# Patient Record
Sex: Female | Born: 1975
Health system: Southern US, Community
[De-identification: ages and names within clinical notes are randomized; demographics above are authoritative.]

## PROBLEM LIST (undated history)

## (undated) DIAGNOSIS — L509 Urticaria, unspecified: Secondary | ICD-10-CM

## (undated) DIAGNOSIS — I10 Essential (primary) hypertension: Secondary | ICD-10-CM

## (undated) DIAGNOSIS — T783XXA Angioneurotic edema, initial encounter: Secondary | ICD-10-CM

## (undated) DIAGNOSIS — J189 Pneumonia, unspecified organism: Secondary | ICD-10-CM

## (undated) HISTORY — PX: BREAST LUMPECTOMY: SHX2

## (undated) HISTORY — DX: Urticaria, unspecified: L50.9

## (undated) HISTORY — PX: WISDOM TOOTH EXTRACTION: SHX21

## (undated) HISTORY — DX: Angioneurotic edema, initial encounter: T78.3XXA

---

## 2019-10-05 ENCOUNTER — Other Ambulatory Visit: Payer: Self-pay | Admitting: Obstetrics and Gynecology

## 2019-10-05 DIAGNOSIS — Z1231 Encounter for screening mammogram for malignant neoplasm of breast: Secondary | ICD-10-CM

## 2019-10-24 DIAGNOSIS — H40053 Ocular hypertension, bilateral: Secondary | ICD-10-CM | POA: Diagnosis not present

## 2019-10-26 DIAGNOSIS — Z975 Presence of (intrauterine) contraceptive device: Secondary | ICD-10-CM | POA: Insufficient documentation

## 2019-10-26 DIAGNOSIS — Z30431 Encounter for routine checking of intrauterine contraceptive device: Secondary | ICD-10-CM | POA: Diagnosis not present

## 2019-10-26 DIAGNOSIS — Z1151 Encounter for screening for human papillomavirus (HPV): Secondary | ICD-10-CM | POA: Diagnosis not present

## 2019-10-26 DIAGNOSIS — Z01419 Encounter for gynecological examination (general) (routine) without abnormal findings: Secondary | ICD-10-CM | POA: Diagnosis not present

## 2019-10-26 DIAGNOSIS — Z124 Encounter for screening for malignant neoplasm of cervix: Secondary | ICD-10-CM | POA: Diagnosis not present

## 2019-10-26 DIAGNOSIS — B372 Candidiasis of skin and nail: Secondary | ICD-10-CM | POA: Diagnosis not present

## 2019-11-28 ENCOUNTER — Other Ambulatory Visit: Payer: Self-pay

## 2019-11-28 ENCOUNTER — Ambulatory Visit
Admission: RE | Admit: 2019-11-28 | Discharge: 2019-11-28 | Disposition: A | Discharge status: Home / Self Care | Payer: BC Managed Care – PPO | Source: Ambulatory Visit | Attending: Obstetrics and Gynecology | Admitting: Obstetrics and Gynecology

## 2019-11-28 DIAGNOSIS — Z1231 Encounter for screening mammogram for malignant neoplasm of breast: Secondary | ICD-10-CM

## 2019-12-01 ENCOUNTER — Other Ambulatory Visit: Payer: Self-pay | Admitting: Obstetrics and Gynecology

## 2019-12-01 DIAGNOSIS — R928 Other abnormal and inconclusive findings on diagnostic imaging of breast: Secondary | ICD-10-CM

## 2019-12-07 ENCOUNTER — Ambulatory Visit
Admission: RE | Admit: 2019-12-07 | Discharge: 2019-12-07 | Disposition: A | Discharge status: Home / Self Care | Payer: BC Managed Care – PPO | Source: Ambulatory Visit | Attending: Obstetrics and Gynecology | Admitting: Obstetrics and Gynecology

## 2019-12-07 ENCOUNTER — Other Ambulatory Visit: Payer: Self-pay

## 2019-12-07 ENCOUNTER — Other Ambulatory Visit: Payer: Self-pay | Admitting: Obstetrics and Gynecology

## 2019-12-07 DIAGNOSIS — R928 Other abnormal and inconclusive findings on diagnostic imaging of breast: Secondary | ICD-10-CM

## 2019-12-07 DIAGNOSIS — N6311 Unspecified lump in the right breast, upper outer quadrant: Secondary | ICD-10-CM | POA: Diagnosis not present

## 2019-12-07 DIAGNOSIS — R922 Inconclusive mammogram: Secondary | ICD-10-CM | POA: Diagnosis not present

## 2019-12-14 ENCOUNTER — Ambulatory Visit
Admission: RE | Admit: 2019-12-14 | Discharge: 2019-12-14 | Disposition: A | Discharge status: Home / Self Care | Payer: BC Managed Care – PPO | Source: Ambulatory Visit | Attending: Obstetrics and Gynecology | Admitting: Obstetrics and Gynecology

## 2019-12-14 ENCOUNTER — Other Ambulatory Visit: Payer: Self-pay

## 2019-12-14 DIAGNOSIS — R928 Other abnormal and inconclusive findings on diagnostic imaging of breast: Secondary | ICD-10-CM

## 2019-12-14 DIAGNOSIS — N62 Hypertrophy of breast: Secondary | ICD-10-CM | POA: Diagnosis not present

## 2019-12-14 DIAGNOSIS — N6311 Unspecified lump in the right breast, upper outer quadrant: Secondary | ICD-10-CM | POA: Diagnosis not present

## 2019-12-14 DIAGNOSIS — N6312 Unspecified lump in the right breast, upper inner quadrant: Secondary | ICD-10-CM | POA: Diagnosis not present

## 2019-12-20 ENCOUNTER — Other Ambulatory Visit: Payer: Self-pay | Admitting: Surgery

## 2019-12-20 DIAGNOSIS — N6021 Fibroadenosis of right breast: Secondary | ICD-10-CM

## 2019-12-28 ENCOUNTER — Other Ambulatory Visit: Payer: Self-pay | Admitting: Surgery

## 2019-12-28 DIAGNOSIS — N6021 Fibroadenosis of right breast: Secondary | ICD-10-CM

## 2019-12-29 ENCOUNTER — Ambulatory Visit: Payer: BC Managed Care – PPO | Admitting: Allergy

## 2019-12-29 ENCOUNTER — Other Ambulatory Visit: Payer: Self-pay

## 2019-12-29 ENCOUNTER — Encounter: Payer: Self-pay | Admitting: Allergy

## 2019-12-29 VITALS — BP 152/100 | HR 78 | Temp 98.6°F | Resp 16 | Ht 65.75 in | Wt 142.0 lb

## 2019-12-29 DIAGNOSIS — Z720 Tobacco use: Secondary | ICD-10-CM

## 2019-12-29 DIAGNOSIS — R030 Elevated blood-pressure reading, without diagnosis of hypertension: Secondary | ICD-10-CM | POA: Diagnosis not present

## 2019-12-29 DIAGNOSIS — D841 Defects in the complement system: Secondary | ICD-10-CM | POA: Diagnosis not present

## 2019-12-29 NOTE — Patient Instructions (Addendum)
   I will review records from your previous allergist. If necessary, we may get some additional bloodwork.  Please call your surgeon and scheduling your lumpectomy at the hospital and not at the surgery center.  Please request to have appointment with anesthesiology before the surgery.  I will send a message to your surgeon about pretreatment prior to your surgery.  I will see if the [facility] hospital has acute medication for HAE.  You should have an at home treatment for acute attacks - Firazyr.  Tammy, our special disease coordinator will contact you about this.   Please keep track of your attacks even mild ones.  We will discuss preventative medications if having frequent attacks.   Follow up in 3 months or sooner if needed.

## 2019-12-29 NOTE — Progress Notes (Signed)
New Patient Note  RE: Jasmine Le MRN: [id number] DOB: 1976-09-16 Date of Office Visit: 12/29/2019  Referring provider: No ref. provider found Primary care provider: Patient, No Pcp Per  Chief Complaint: Establish Care (angio edema)  History of Present Illness: I had the pleasure of seeing Jasmine Le for initial evaluation at the [facility] on 01/02/2020. She is a 44 y.o. female, who is self-referred here for the evaluation of establishing care for hereditary angioedema.  Patient is having lumpectomy surgery in March 2021 and wants to establish care with allergist due to her hereditary angioedema diagnosis.   Patient was diagnosed with HAE in her early 19s in [place] - requested records. She is not sure what exactly was done or what her levels were.   Patient was having issues with swelling of her face, feet, hands and abdomina area. She had 1 episode of throat swelling which required ICU visit but no intubation.   Patient initially had several attacks per year (less than 10) and in the past 15 years only had 2 bad episodes requiring ER visit. However, she does have minor attacks of her hand and feet throughout the year. The attacks usually lasts for a few days at a time.  Last major episode was in 2019 and went to the ER. Treated successfully with Berinert and did not required overnight stay.  No family history of angioedema and her family was tested at initial diagnosis with no other positives.   Patient was never on any type of prophylactic treatment for HAE and she does not have any acute treatment medications either.   Patient had oral surgery about 5-10 years ago and was premedicated with IV infusion with no issues. Since she move to [facility], she hasn't had any dental check ups or work done as she hasn't found a Database administrator yet.   Dr. Coralie Keens is the surgeon who is going to do the lumpectomy. Apparently they called her and was told the surgery is  scheduled as of now at the surgery center and not at the hospital. She has not med with the anesthesiologist yet.   Patient does not have a local PCP.   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 She denies new allergenic exposure slight difficulty swallowing no difficulty breathing symptoms gradual onset not recently most previous about 5 years ago no fever chills chest pain shortness of breath no modifying factors."  Assessment and Plan: Jasmine Le is a 44 y.o. female with: Hereditary angioedema ([place]) Patient was diagnosed with HAE in her 71s in [place] after having frequent bouts of angioedema of her face, feet, hands, abdominal area - records were requested. One episode of laryngeal swelling requiring ICU stay without intubation. Since her initially diagnosis, severe attacks have diminished and last ER visit was in 2019 requiring Berinert. However, she does have milder angioedema of hands and feet throughout the year. No previous prophylactic medications and does not have acute medicine on hand either. She needs to establish care with allergy as she is having a breast lumpectomy in March 2021. Previously she had prophylactic IV  medications prior to an oral surgery 5-10 years ago and did well. No other family members with HAE.   I will review records from previous allergist. If necessary, we may get some additional bloodwork.  Patient needs to have the surgery done at the hospital and not at a surgical center as surgery is a trigger for an HAE attack.   Advised patient to request to  speak with anesthesiology before the surgery as well to make them aware of her HAE history.   Patient will need pretreatment with C1-Inhibitor prior to surgery. Spoke with inpatient pharmacy at [facility] and they do have Berinert on hand - she will need 1000 Units at least 1 hour before surgery (but no more than 6 hours before procedure) and have 2 additional doses on hand in case she has an HAE attack during or post surgery.   Patient needs to have an at home treatment for acute attacks. Will prescribe Firazyr and will let our coordinator know so we can start PA process if needed.   Please keep track of attacks even mild ones.  We will discuss preventative medications if having frequent attacks.   Elevated blood pressure reading Elevated reading in our office. Patient does not have a local PCP.  Establish care wit PCP and follow up regarding the blood pressure.   Return in about 3 months (around 03/27/2020).  No orders of the defined types were placed in this encounter.  Other allergy screening: Asthma: no Rhino conjunctivitis: no Food allergy: no Medication allergy: yes  Ibuprofen, erythromycin - nausea/vomiting Ace inhibitors - angioedema Thiethylperazine - anaphylaxis  Hymenoptera allergy: no Urticaria: yes  Yes sometimes Eczema:no History of recurrent infections suggestive of immunodeficency: no  Diagnostics: None.  Past Medical History: Patient Active Problem List   Diagnosis Date Noted  . Tobacco user 01/02/2020  . Hereditary angioedema ([place]) 12/29/2019  . Elevated blood pressure reading 12/29/2019   Past Medical History:  Diagnosis Date  . Angio-edema   . Urticaria    Past Surgical History: History reviewed. No pertinent surgical history. Medication List:  Current Outpatient Medications  Medication Sig Dispense Refill  . fexofenadine (ALLEGRA) 180 MG tablet Take 180 mg by mouth daily.    Marland Kitchen levonorgestrel (MIRENA) 20 MCG/24HR IUD by Intrauterine route.       No current facility-administered medications for this visit.   Allergies: Allergies  Allergen Reactions  . Thiethylperazine Anaphylaxis  . Ace Inhibitors Swelling  . Erythromycin Nausea And Vomiting  . Ibuprofen Nausea And Vomiting   Social History: Social History   Socioeconomic History  . Marital status: Married    Spouse name: Not on file  . Number of children: Not on file  . Years of education: Not on file  . Highest education level: Not on file  Occupational History  . Not on file  Tobacco Use  . Smoking status: Current Every Day Smoker  . Smokeless tobacco: Never Used  Substance and Sexual Activity  . Alcohol use: Never  . Drug use: Never  . Sexual activity: Not on file  Other Topics Concern  . Not on file  Social History Narrative  . Not on file   Social Determinants of Health   Financial Resource Strain:   . Difficulty of Paying Living Expenses: Not on file  Food Insecurity:   . Worried About Charity fundraiser in the Last Year: Not on file  . Ran Out of Food in the Last Year: Not on file  Transportation  Needs:   . Lack of Transportation (Medical): Not on file  . Lack of Transportation (Non-Medical): Not on file  Physical Activity:   . Days of Exercise per Week: Not on file  . Minutes of Exercise per Session: Not on file  Stress:   . Feeling of Stress : Not on file  Social Connections:   . Frequency of Communication with Friends and Family: Not on file  . Frequency of Social Gatherings with Friends and Family: Not on file  . Attends Religious Services: Not on file  . Active Member of Clubs or Organizations: Not on file  . Attends Archivist Meetings: Not on file  . Marital Status: Not on file   Lives in a [address] year old condo. Smoking: <1 pack per day x 20+ yrs Occupation: Programmer, multimedia History: Water Damage/mildew in the house: no Carpet in the family room: no Carpet in the bedroom: yes Heating: electric Cooling:  central Pet: yes dog and cat  Family History: Family History  Problem Relation Age of Onset  . Asthma Mother   . Allergic rhinitis Neg Hx   . Eczema Neg Hx   . Urticaria Neg Hx    Problem                               Relation Angioedema    No  Review of Systems  Constitutional: Negative for appetite change, chills, fever and unexpected weight change.  HENT: Negative for congestion and rhinorrhea.   Eyes: Negative for itching.  Respiratory: Negative for cough, chest tightness, shortness of breath and wheezing.   Cardiovascular: Negative for chest pain.  Gastrointestinal: Negative for abdominal pain.  Genitourinary: Negative for difficulty urinating.  Skin: Negative for rash.  Allergic/Immunologic: Negative for environmental allergies and food allergies.  Neurological: Negative for headaches.   Objective: BP (!) 152/100 (BP Location: Left Arm, Patient Position: Sitting, Cuff Size: Normal)   Pulse 78   Temp 98.6 F (37 C) (Temporal)   Resp 16   Ht 5' 5.75" (1.67 m)   Wt 142 lb (64.4 kg)   SpO2 98%   BMI 23.10 kg/m  Body mass index is 23.1 kg/m. Physical Exam  Constitutional: She is oriented to person, place, and time. She appears well-developed and well-nourished.  HENT:  Head: Normocephalic and atraumatic.  Right Ear: External ear normal.  Left Ear: External ear normal.  Nose: Nose normal.  Mouth/Throat: Oropharynx is clear and moist.  Eyes: Conjunctivae and EOM are normal.  Cardiovascular: Normal rate, regular rhythm and normal heart sounds. Exam reveals no gallop and no friction rub.  No murmur heard. Pulmonary/Chest: Effort normal and breath sounds normal. She has no wheezes. She has no rales.  Abdominal: Soft.  Musculoskeletal:     Cervical back: Neck supple.  Neurological: She is alert and oriented to person, place, and time.  Skin: Skin is warm. No rash noted.  Psychiatric: She has a normal mood and affect. Her behavior is normal.  Nursing note and vitals  reviewed.  The plan was reviewed with the patient/family, and all questions/concerned were addressed.  It was my pleasure to see Jasmine Le today and participate in her care. Please feel free to contact me with any questions or concerns.  Sincerely,  [name], DO Allergy & Immunology  [facility] office: [phone] [facility] office: [place] office: [phone]

## 2020-01-02 ENCOUNTER — Telehealth: Payer: Self-pay

## 2020-01-02 DIAGNOSIS — Z720 Tobacco use: Secondary | ICD-10-CM | POA: Insufficient documentation

## 2020-01-02 NOTE — Telephone Encounter (Signed)
Routed call to [employer], [facility], she is looking into this for the patient and will call us back.

## 2020-01-02 NOTE — Assessment & Plan Note (Addendum)
Patient was diagnosed with HAE in her 68s in [place] after having frequent bouts of angioedema of her face, feet, hands, abdominal area - records were requested. One episode of laryngeal swelling requiring ICU stay without intubation. Since her initially diagnosis, severe attacks have diminished and last ER visit was in 2019 requiring Berinert. However, she does have milder angioedema of hands and feet throughout the year. No previous prophylactic medications and does not have acute medicine on hand either. She needs to establish care with allergy as she is having a breast lumpectomy in March 2021. Previously she had prophylactic IV medications prior to an oral surgery 5-10 years ago and did well. No other family members with HAE.   I will review records from previous allergist. If necessary, we may get some additional bloodwork.  Patient needs to have the surgery done at the hospital and not at a surgical center as surgery is a trigger for an HAE attack.   Advised patient to request to speak with anesthesiology before the surgery as well to make them aware of her HAE history.   Patient will need pretreatment with C1-Inhibitor prior to surgery. Spoke with inpatient pharmacy at [facility] and they do have Berinert on hand - she will need 1000 Units at least 1 hour before surgery (but no more than 6 hours before procedure) and have 2 additional doses on hand in case she has an HAE attack during or post surgery.   Patient needs to have an at home treatment for acute attacks. Will prescribe Firazyr and will let our coordinator know so we can start PA process if needed.   Please keep track of attacks even mild ones.  We will discuss preventative medications if having frequent attacks.

## 2020-01-02 NOTE — Telephone Encounter (Signed)
Earl-operating pharmacist [phone], per Crystal, he is the one who is more consistent with being in the operating room.

## 2020-01-02 NOTE — Telephone Encounter (Signed)
-----   Message from [name], DO sent at 01/02/2020 11:41 AM EST ----- Regarding: HAE medications in the hospital Patient has hereditary angioedema.  She is scheduled to have a right breast lumpectomy on 02/01/2020.  Can someone call the [facility] and see if they have any of these drugs on hand for hereditary angioedema patients?  Also, can you make sure you can get a phone number for contact person? I'm more than happy to speak with the pharmacist.   Ideally, she will need a plasma-derived C1-inhibitor infusion (20 units per kg) 1 hour before the procedure and have 2 additional doses available for on-demand use in case she develops symptoms during the surgery or after the surgery.  "Alternatively, ecallantide (a kallikrein inhibitor), icatibant (a bradykinin B2-receptor antagonist), and recombinant human C1 inhibitor (C1-INH) could be used if symptoms develop (although ecallantide and icatibant are not used before the procedure, as their efficacy as prophylaxis is not known)."  Thank you.

## 2020-01-02 NOTE — Assessment & Plan Note (Signed)
Elevated reading in our office. Patient does not have a local PCP.  Establish care wit PCP and follow up regarding the blood pressure.

## 2020-01-02 NOTE — Telephone Encounter (Addendum)
Talked with Crystal and will CC Earl as well.  This is the plan:  Patient will need pretreatment with C1-Inhibitor prior to surgery.   She will need Berinert 1000 Units at least 1 hour before surgery (but no more than 6 hours before procedure) and have 2 additional doses on hand in case she has an HAE attack during or post surgery.  The dosing for acute attack would be 20 Units/kg.   Stabilization or improvement in symptoms is usually seen within 30 minutes in laryngeal or gastrointestinal attacks. Fewer than 1 percent of attacks require a second dose.  If have questions, please call: [facility] office: [phone]

## 2020-01-03 ENCOUNTER — Telehealth: Payer: Self-pay | Admitting: *Deleted

## 2020-01-03 NOTE — Telephone Encounter (Signed)
Spoke to patient and advised approval process for [employer] and copay.  I am may need her previous labs to get approval. Can you advise if same was requested from her previous allergist?

## 2020-01-03 NOTE — Telephone Encounter (Signed)
-----   Message from [name], DO sent at 01/02/2020  1:19 PM EST ----- Jasmine Le, patient has HAE and she needs Firazyr for acute attack treatment. Do we need to do a PA for this? Thank you.

## 2020-01-04 ENCOUNTER — Telehealth: Payer: Self-pay

## 2020-01-04 NOTE — Telephone Encounter (Signed)
Medical records request for was faxed. The office called and stated that patient has not been seen in many years and the physician has retired. She gave Jasmine Le information to call to get records. Called [phone] and the line was busy. Will try again later.

## 2020-01-04 NOTE — Telephone Encounter (Signed)
error 

## 2020-01-04 NOTE — Telephone Encounter (Signed)
Dr. Shona Simpson was the patients doctor.

## 2020-01-04 NOTE — Telephone Encounter (Signed)
Yes, I am requesting records. Will check tomorrow once I go to [facility] office if they are there. If not, then will fax to request again.

## 2020-01-06 NOTE — Telephone Encounter (Signed)
Called back today and was informed to call [phone] for records of have the patient go to [email or web address] to request patient records. Called and left a message for a return call in regards to medical records.

## 2020-01-09 NOTE — Telephone Encounter (Signed)
Received a call back and was inform they will need [place] to fax over the medical release for to them at [phone] with the doctors Rayetta Pigg name on it. Medical release for is in [facility] and will be faxed tomorrow 01/10/2020.

## 2020-01-09 NOTE — Telephone Encounter (Signed)
Per PA will need to include labs for approval

## 2020-01-10 NOTE — Telephone Encounter (Signed)
Record request forms have been faxed to [phone].

## 2020-01-12 ENCOUNTER — Telehealth: Payer: Self-pay | Admitting: Allergy

## 2020-01-12 DIAGNOSIS — D841 Defects in the complement system: Secondary | ICD-10-CM

## 2020-01-12 NOTE — Telephone Encounter (Signed)
Received records today and they have been place on Dr. Julianne Rice desk for review.

## 2020-01-12 NOTE — Telephone Encounter (Signed)
Spoke with patient.  We need to draw bloodwork as old records do not contain actual results. Patient will go to [facility] office to have it drawn today.   Patient scheduled to have surgery on 01/22/2020 at [facility].  She is scheduled to talk with her anesthesiology team on 01/25/2020.  I spoke with pharmacy at [facility] on 01/02/20 and aware of below plan.  Patient will need pretreatment with C1-Inhibitor prior to surgery.  ? She will need Berinert 1000 Units at least 1 hour before surgery (but no more than 6 hours beforeprocedure) and have 2 additional doses on hand in case she has an HAE attack during or post surgery.  The dosing for acute attack would be 20 Units/kg.  ? Stabilization or improvement in symptoms is usually seen within 30 minutes in laryngeal or gastrointestinal attacks. Fewer than 1 percent of attacks require a second dose.

## 2020-01-18 LAB — C1 ESTERASE INHIBITOR, FUNCTIONAL: C1INH Functional/C1INH Total MFr SerPl: 23 %mean normal — ABNORMAL LOW

## 2020-01-18 LAB — C3 AND C4
Complement C3, Serum: 81 mg/dL — ABNORMAL LOW (ref 82–167)
Complement C4, Serum: <2 mg/dL — ABNORMAL LOW (ref 12–38)

## 2020-01-18 LAB — C1 ESTERASE INHIBITOR: C1INH SerPl-mCnc: 6 mg/dL — ABNORMAL LOW (ref 21–39)

## 2020-01-18 NOTE — Telephone Encounter (Signed)
I have been watching patients labs and have submitted for approval today for [employer]

## 2020-01-23 ENCOUNTER — Other Ambulatory Visit: Payer: Self-pay | Admitting: *Deleted

## 2020-01-23 MED ORDER — ICATIBANT ACETATE 30 MG/3ML ~~LOC~~ SOLN: 30.0000 mg | Freq: Once | SUBCUTANEOUS | 11 refills | Status: AC

## 2020-01-23 NOTE — Telephone Encounter (Signed)
Called patient and advised approval and Rx to [employer] also enrolled in [place] the support and copay program for [employer]

## 2020-01-24 NOTE — Progress Notes (Signed)
[id number] [place],  - [address]. AT [address]. [place] [address] Phone: [phone] Fax: [phone]  [place], [place] [address] [place] [address] Phone: [phone] Fax: [phone]      Your procedure is scheduled on Wednesday February 01, 2020.  Report to [facility] Main Entrance "A" at 09:00 A.M., and check in at the Admitting office.  Call this number if you have problems, questions, or concerns between now and your arrival time the morning of surgery: [phone]    Remember:  Do not eat after midnight the night before your surgery  You may drink clear liquids until 08:00 the morning of your surgery.   Clear liquids allowed are: Water, Non-Citrus Juices (without pulp), Carbonated Beverages, Clear Tea, Black Coffee Only, and Gatorade  Please complete your PRE-SURGERY ENSURE that was provided to you by 08:00am the morning of surgery.  Please, if able, drink it in one sitting. DO NOT SIP.     Take these medicines the morning of surgery with A SIP OF WATER: Fexofenadine (Allegra)  7 days prior to surgery STOP taking any Aspirin (unless otherwise instructed by your surgeon), Aleve, Naproxen, Ibuprofen, Motrin, Advil, Goody's, BC's, all herbal medications, fish oil, and all vitamins.    The Morning of Surgery  Do not wear jewelry, make-up or nail polish.  Do not wear lotions, powders, perfumes, or deodorant  Do not shave 48 hours prior to surgery.  Do not bring valuables to the hospital.  [facility] is not responsible for any belongings or valuables.  If you are a smoker, DO NOT Smoke 24 hours prior to surgery  If you wear a CPAP at night please bring your mask the morning of surgery   Remember that you must have someone to transport you home after your surgery, and remain with you for 24 hours if you are discharged the same  day.   Please bring cases for contacts, glasses, hearing aids, dentures or bridgework because it cannot be worn into surgery.    Leave your suitcase in the car.  After surgery it may be brought to your room.  For patients admitted to the hospital, discharge time will be determined by your treatment team.  Patients discharged the day of surgery will not be allowed to drive home.    Special instructions:   [facility]- Preparing For Surgery  Before surgery, you can play an important role. Because skin is not sterile, your skin needs to be as free of germs as possible. You can reduce the number of germs on your skin by washing with CHG (chlorahexidine gluconate) Soap before surgery.  CHG is an antiseptic cleaner which kills germs and bonds with the skin to continue killing germs even after washing.    Oral Hygiene is also important to reduce your risk of infection.  Remember - BRUSH YOUR TEETH THE MORNING OF SURGERY WITH YOUR REGULAR TOOTHPASTE  Please do not use if you have an allergy to CHG or antibacterial soaps. If your skin becomes reddened/irritated stop using the CHG.  Do not shave (including legs and underarms) for at least 48 hours prior to first CHG shower. It is OK to shave your face.  Please follow these instructions carefully.   1. Shower the NIGHT BEFORE SURGERY and the MORNING OF SURGERY with CHG Soap.   2. If you chose to wash your hair, wash your hair first as  usual with your normal shampoo.  3. After you shampoo, rinse your hair and body thoroughly to remove the shampoo.  4. Use CHG as you would any other liquid soap. You can apply CHG directly to the skin and wash gently with a scrungie or a clean washcloth.   5. Apply the CHG Soap to your body ONLY FROM THE NECK DOWN.  Do not use on open wounds or open sores. Avoid contact with your eyes, ears, mouth and genitals (private parts). Wash Face and genitals (private parts)  with your normal soap.   6. Wash thoroughly,  paying special attention to the area where your surgery will be performed.  7. Thoroughly rinse your body with warm water from the neck down.  8. DO NOT shower/wash with your normal soap after using and rinsing off the CHG Soap.  9. Pat yourself dry with a CLEAN TOWEL.  10. Wear CLEAN PAJAMAS to bed the night before surgery, wear comfortable clothes the morning of surgery  11. Place CLEAN SHEETS on your bed the night of your first shower and DO NOT SLEEP WITH PETS.    Day of Surgery:  Please shower the morning of surgery with the CHG soap Do not apply any deodorants/lotions. Please wear clean clothes to the hospital/surgery center.   Remember to brush your teeth WITH YOUR REGULAR TOOTHPASTE.   Please read over the following fact sheets that you were given.

## 2020-01-25 ENCOUNTER — Encounter (HOSPITAL_COMMUNITY): Payer: Self-pay

## 2020-01-25 ENCOUNTER — Other Ambulatory Visit: Payer: Self-pay

## 2020-01-25 ENCOUNTER — Encounter (HOSPITAL_COMMUNITY)
Admission: RE | Admit: 2020-01-25 | Discharge: 2020-01-25 | Disposition: A | Discharge status: Home / Self Care | Payer: BC Managed Care – PPO | Source: Ambulatory Visit | Attending: Surgery | Admitting: Surgery

## 2020-01-25 DIAGNOSIS — D841 Defects in the complement system: Secondary | ICD-10-CM | POA: Diagnosis not present

## 2020-01-25 DIAGNOSIS — Z01818 Encounter for other preprocedural examination: Secondary | ICD-10-CM | POA: Diagnosis not present

## 2020-01-25 HISTORY — DX: Pneumonia, unspecified organism: J18.9

## 2020-01-25 LAB — CBC
HCT: 49.2 % — ABNORMAL HIGH (ref 36.0–46.0)
Hemoglobin: 16.4 g/dL — ABNORMAL HIGH (ref 12.0–15.0)
MCH: 31.5 pg (ref 26.0–34.0)
MCHC: 33.3 g/dL (ref 30.0–36.0)
MCV: 94.4 fL (ref 80.0–100.0)
Platelets: 256 10*3/uL (ref 150–400)
RBC: 5.21 MIL/uL — ABNORMAL HIGH (ref 3.87–5.11)
RDW: 13 % (ref 11.5–15.5)
WBC: 8.8 10*3/uL (ref 4.0–10.5)
nRBC: 0 % (ref 0.0–0.2)

## 2020-01-25 LAB — BASIC METABOLIC PANEL
Anion gap: 9 (ref 5–15)
BUN: 13 mg/dL (ref 6–20)
CO2: 28 mmol/L (ref 22–32)
Calcium: 10 mg/dL (ref 8.9–10.3)
Chloride: 101 mmol/L (ref 98–111)
Creatinine, Ser: 0.99 mg/dL (ref 0.44–1.00)
GFR calc Af Amer: >60 mL/min (ref >60)
GFR calc non Af Amer: >60 mL/min (ref >60)
Glucose, Bld: 97 mg/dL (ref 70–99)
Potassium: 4.5 mmol/L (ref 3.5–5.1)
Sodium: 138 mmol/L (ref 135–145)

## 2020-01-25 LAB — POCT PREGNANCY, URINE: Preg Test, Ur: NEGATIVE

## 2020-01-25 NOTE — Consult Note (Signed)
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  She has never had surgery but has had previous episodes of swelling involving the hands, face, and abdominal area. She required 1 hospitalization with  ICU  admission but no intubation.  She is now scheduled to undergo Right breast lumpectomy with Dr. Ninfa Linden on 3/17.  She saw Dr. [name] who is an allergist and asthma specialist.  Dr. Maudie Mercury recommended Berinert (C1-inhibitor)  1000 units at least 1 hour before surgery and no more than 6 hours before the procedure and to have 2 additional doses on hand in case she has an angioedema attack  during or post surgery.  I saw the patient during her preop visit with Karoline Caldwell, PA and we discussed her condition and the anesthetic plan.  Tentatively will plan pretreatment with Berinert and then a pectoralis block with MAC.  The additional rescue dosing of Berinert will be immediately available as well.  We will also plan to notify the breast center prior to her radioactive seed insertion the day before surgery that the patient should be moved to the ED immediately should she develop a reaction at the time of her breast her seed implant. She recently underwent uneventful breat biopsy.  Karoline Caldwell and I also discussed the plan with Dr. Maudie Mercury and we were all in agreement.  Roberts Gaudy, MD

## 2020-01-25 NOTE — Progress Notes (Signed)
PCP - no PCP per patient Cardiologist - n/a Immunologist/Allergist - Dr. Maudie Mercury  PPM/ICD - n/a Device Orders -  Rep Notified -   Chest x-ray - n/a EKG - n/a Stress Test - Patient Denies ECHO - Patient Denies Cardiac Cath - Patient Denies  Sleep Study - Patient Denies CPAP -   Fasting Blood Sugar - n/a Checks Blood Sugar _____ times a day  Blood Thinner Instructions: n/a Aspirin Instructions:n/a  ERAS Protcol - clears by 0800 PRE-SURGERY Ensure or G2- ensure given at PAT appointment  COVID TEST- scheduled for 01/28/20  Spoke with Cherlynn Kaiser, OR pharmacist, who is aware of the premedication, Berinert 1000 units, needed for the morning of surgery.  Anesthesia review: yes, hx of hereditary angio-edema  Patient denies shortness of breath, fever, cough and chest pain at PAT appointment   All instructions explained to the patient, with a verbal understanding of the material. Patient agrees to go over the instructions while at home for a better understanding. Patient also instructed to self quarantine after being tested for COVID-19. The opportunity to ask questions was provided.

## 2020-01-26 NOTE — Progress Notes (Signed)
Anesthesia Chart Review:  History of hereditary angioedema diagnosed in her 69s in [place] after having frequent bouts of angioedema of her face, feet, hands, abdominal area. One episode of laryngeal swelling requiring ICU stay without intubation. Since her initial diagnosis, severe attacks have diminished and last ER visit was in 2019 requiring Berinert which did rapidly treat her attack. However, she does have milder angioedema of hands and feet throughout the year. She has recently established with allergy/immunology Dr. [name] in preparation for upcoming surgery. Bloodwork was done 01/12/20 confirming low levels of C4, c1 inhibitor function and c1 inhibitor level.  Pt says she did not have any significant reaction to recent breast biopsy.  Dr. Maudie Mercury has outlined a perioperative treatment plan which is copied below:  Patient will need pretreatment with C1-Inhibitor prior to surgery.  ? She will need Berinert 1000 Units at least 1 hour before surgery (but no more than 6 hours beforeprocedure) and have 2 additional doses on hand in case she has an HAE attack during or post surgery.  The dosing for acute attack would be 20 Units/kg.  ? Stabilization or improvement in symptoms is usually seen within 30 minutes in laryngeal or gastrointestinal attacks. Fewer than 1 percent of attacks require a second dose.  This plan has been discussed with OR pharmacy and the OR pharmacists are aware and will have the medication ready on DOS. PAT nurse Jasmine Planas, RN spoke with OR pharmacists to confirm and was also advised that additional doses of Berinert would be brought over from [facility] to have on hand.  This plan has also been discussed with anesthesiologist Dr. Linna Caprice who also evaluated the pt at her PAT visit. See his note also from 01/25/20.  Jasmine Forte, RN is also aware of periop plan and has instructed pt to arrive one hour earlier than previously stated (now to arrive at 8am).  I called and made  the [facility] aware as well. They have printed Dr. Verneda Skill note and put it in her chart in preparation for seed placement on 3/16.  Preop labs reviewed, unremarkable.  Jasmine Le [facility]/Anesthesiology Phone [phone] 01/26/2020 1:31 PM

## 2020-01-28 ENCOUNTER — Other Ambulatory Visit (HOSPITAL_COMMUNITY)
Admission: RE | Admit: 2020-01-28 | Discharge: 2020-01-28 | Disposition: A | Discharge status: Home / Self Care | Payer: BC Managed Care – PPO | Source: Ambulatory Visit | Attending: Surgery | Admitting: Surgery

## 2020-01-28 DIAGNOSIS — Z01812 Encounter for preprocedural laboratory examination: Secondary | ICD-10-CM | POA: Diagnosis not present

## 2020-01-28 DIAGNOSIS — Z20822 Contact with and (suspected) exposure to covid-19: Secondary | ICD-10-CM | POA: Diagnosis not present

## 2020-01-28 LAB — SARS CORONAVIRUS 2 (TAT 6-24 HRS): SARS Coronavirus 2: NEGATIVE

## 2020-01-30 ENCOUNTER — Other Ambulatory Visit: Payer: Self-pay | Admitting: Surgery

## 2020-01-30 MED ORDER — C1 ESTERASE INHIBITOR (HUMAN) 500 UNITS IV KIT: 1000.0000 [IU] | PACK | Freq: Once | INTRAVENOUS | Status: DC

## 2020-01-30 MED ORDER — SODIUM CHLORIDE 0.9 % IV SOLN: INTRAVENOUS | Status: DC

## 2020-01-31 ENCOUNTER — Ambulatory Visit
Admission: RE | Admit: 2020-01-31 | Discharge: 2020-01-31 | Disposition: A | Discharge status: Home / Self Care | Payer: BC Managed Care – PPO | Source: Ambulatory Visit | Attending: Surgery | Admitting: Surgery

## 2020-01-31 ENCOUNTER — Other Ambulatory Visit: Payer: Self-pay

## 2020-01-31 DIAGNOSIS — N6081 Other benign mammary dysplasias of right breast: Secondary | ICD-10-CM | POA: Diagnosis not present

## 2020-01-31 DIAGNOSIS — N6021 Fibroadenosis of right breast: Secondary | ICD-10-CM

## 2020-01-31 NOTE — H&P (Signed)
Esau Grew  Location: [facility] Patient #: [id number] DOB: 07/14/1976 Married / Language: English / Race: White Female   History of Present Illness   The patient is a 44 year old female who presents with a breast mass. This patient is referred for a mass in the right breast. She actually had a small mass seen at the 12 o'clock position of the right breast on screening mammography. An ultrasound was performed as well as diagnostic films of the mass which measured approximately 1.1 cm. She underwent a stereotactic biopsy showing a complex sclerosing lesion. There is no family history of breast cancer. She denies nipple discharge. Removal of the mass for histologic evaluation has been recommended to rule out malignancy. She has a history of a C1 esterase deficiency is otherwise healthy.   Past Surgical History Oral Surgery   Diagnostic Studies History  Colonoscopy  never Mammogram  within last year Pap Smear  1-5 years ago  Allergies  ACE Inhibitors  Ibuprofen *ANALGESICS - ANTI-INFLAMMATORY*  Torecan *ANTIEMETICS*  Allergies Reconciled   Medication History No Current Medications Medications Reconciled  Social History  Alcohol use  Occasional alcohol use. Caffeine use  Coffee. No drug use  Tobacco use  Current every day smoker.  Family History Depression  Mother. Diabetes Mellitus  Sister. Family history unknown  First Degree Relatives   Pregnancy / Birth History  Age at menarche  17 years. Contraceptive History  Intrauterine device. Gravida  0 Irregular periods  Para  0    Review of Systems  General Not Present- Appetite Loss, Chills, Fatigue, Fever, Night Sweats, Weight Gain and Weight Loss. Skin Not Present- Change in Wart/Mole, Dryness, Hives, Jaundice, New Lesions, Non-Healing Wounds, Rash and Ulcer. HEENT Not Present- Earache, Hearing Loss, Hoarseness, Nose Bleed, Oral Ulcers, Ringing in the Ears, Seasonal  Allergies, Sinus Pain, Sore Throat, Visual Disturbances, Wears glasses/contact lenses and Yellow Eyes. Respiratory Not Present- Bloody sputum, Chronic Cough, Difficulty Breathing, Snoring and Wheezing. Breast Present- Breast Mass. Not Present- Breast Pain, Nipple Discharge and Skin Changes. Cardiovascular Not Present- Chest Pain, Difficulty Breathing Lying Down, Leg Cramps, Palpitations, Rapid Heart Rate, Shortness of Breath and Swelling of Extremities. Gastrointestinal Not Present- Abdominal Pain, Bloating, Bloody Stool, Change in Bowel Habits, Chronic diarrhea, Constipation, Difficulty Swallowing, Excessive gas, Gets full quickly at meals, Hemorrhoids, Indigestion, Nausea, Rectal Pain and Vomiting. Female Genitourinary Not Present- Frequency, Nocturia, Painful Urination, Pelvic Pain and Urgency. Musculoskeletal Not Present- Back Pain, Joint Pain, Joint Stiffness, Muscle Pain, Muscle Weakness and Swelling of Extremities. Neurological Not Present- Decreased Memory, Fainting, Headaches, Numbness, Seizures, Tingling, Tremor, Trouble walking and Weakness. Psychiatric Not Present- Anxiety, Bipolar, Change in Sleep Pattern, Depression, Fearful and Frequent crying. Endocrine Not Present- Cold Intolerance, Excessive Hunger, Hair Changes, Heat Intolerance, Hot flashes and New Diabetes. Hematology Not Present- Blood Thinners, Easy Bruising, Excessive bleeding, Gland problems, HIV and Persistent Infections.  Vitals  Weight: 142.2 lb Height: 65in Body Surface Area: 1.71 m Body Mass Index: 23.66 kg/m  Temp.: 98.68F  Pulse: 98 (Regular)  BP: 158/90 (Sitting, Left Arm, Standard)     Physical Exam The physical exam findings are as follows: Note:She appears well and exam  There is no cervical, supra clavicular, or axillary adenopathy.  There is no palpable right breast mass except for a small hematoma at the biopsy site. Her small incisions well-healed. The nipple areolar complex is  normal.  I have reviewed her mammogram and ultrasound as well as pathology results.    Assessment &  Plan  SCLEROSING ADENOSIS OF BREAST, RIGHT (N60.21)  Impression: I discussed the diagnosis with the patient and with her husband by phone. We discussed that removal of this area is recommended for complete histologic evaluation from malignancy in the right breast. I discussed a radioactive seed guided right breast lumpectomy. She is concerned about her C1 esterase deficiency and we will get a preoperative consult with anesthesiology regarding this. I discussed the surgical procedure in detail. I discussed risks of bleeding, infection, injury to surrounding structures, the need for further surgery if malignancy is found, cardiopulmonary issues, postoperative recovery, etc. After a thorough discussion, they wished to proceed with surgery This patient encounter took 30 minutes today to perform the following: take history, perform exam, review outside records, interpret imaging, counsel the patient on their diagnosis and document encounter, findings & plan in the EHR

## 2020-02-01 ENCOUNTER — Encounter (HOSPITAL_COMMUNITY)
Admission: RE | Disposition: A | Discharge status: Home / Self Care | Payer: Self-pay | Source: Home / Self Care | Attending: Surgery

## 2020-02-01 ENCOUNTER — Encounter (HOSPITAL_COMMUNITY): Payer: Self-pay | Admitting: Surgery

## 2020-02-01 ENCOUNTER — Ambulatory Visit (HOSPITAL_COMMUNITY): Payer: BC Managed Care – PPO | Admitting: Physician Assistant

## 2020-02-01 ENCOUNTER — Other Ambulatory Visit: Payer: Self-pay

## 2020-02-01 ENCOUNTER — Ambulatory Visit (HOSPITAL_COMMUNITY)
Admission: RE | Admit: 2020-02-01 | Discharge: 2020-02-01 | Disposition: A | Discharge status: Home / Self Care | Payer: BC Managed Care – PPO | Attending: Surgery | Admitting: Surgery

## 2020-02-01 ENCOUNTER — Other Ambulatory Visit: Payer: Self-pay | Admitting: Surgery

## 2020-02-01 ENCOUNTER — Ambulatory Visit
Admission: RE | Admit: 2020-02-01 | Discharge: 2020-02-01 | Disposition: A | Discharge status: Home / Self Care | Payer: BC Managed Care – PPO | Source: Ambulatory Visit | Attending: Surgery | Admitting: Surgery

## 2020-02-01 DIAGNOSIS — N6021 Fibroadenosis of right breast: Secondary | ICD-10-CM

## 2020-02-01 DIAGNOSIS — D841 Defects in the complement system: Secondary | ICD-10-CM | POA: Insufficient documentation

## 2020-02-01 DIAGNOSIS — N6081 Other benign mammary dysplasias of right breast: Secondary | ICD-10-CM | POA: Diagnosis not present

## 2020-02-01 DIAGNOSIS — N6489 Other specified disorders of breast: Secondary | ICD-10-CM | POA: Insufficient documentation

## 2020-02-01 DIAGNOSIS — R921 Mammographic calcification found on diagnostic imaging of breast: Secondary | ICD-10-CM | POA: Diagnosis not present

## 2020-02-01 HISTORY — PX: BREAST LUMPECTOMY WITH RADIOACTIVE SEED LOCALIZATION: SHX6424

## 2020-02-01 SURGERY — BREAST LUMPECTOMY WITH RADIOACTIVE SEED LOCALIZATION
Anesthesia: Monitor Anesthesia Care | Site: Breast | Laterality: Right

## 2020-02-01 MED ORDER — PROPOFOL 500 MG/50ML IV EMUL: INTRAVENOUS | Status: DC | PRN

## 2020-02-01 MED ORDER — ONDANSETRON HCL 4 MG/2ML IJ SOLN
INTRAMUSCULAR | Status: DC | PRN
  Administered 2020-02-01: 4 mg via INTRAVENOUS

## 2020-02-01 MED ORDER — FENTANYL CITRATE (PF) 100 MCG/2ML IJ SOLN: 100.0000 ug | Freq: Once | INTRAMUSCULAR | Status: AC

## 2020-02-01 MED ORDER — BUPIVACAINE HCL (PF) 0.25 % IJ SOLN
INTRAMUSCULAR | Status: AC
  Filled 2020-02-01: qty 30

## 2020-02-01 MED ORDER — CHLORHEXIDINE GLUCONATE CLOTH 2 % EX PADS: 6.0000 | MEDICATED_PAD | Freq: Once | CUTANEOUS | Status: DC

## 2020-02-01 MED ORDER — TRAMADOL HCL 50 MG PO TABS: 50.0000 mg | ORAL_TABLET | Freq: Four times a day (QID) | ORAL | 0 refills | Status: DC | PRN

## 2020-02-01 MED ORDER — LIDOCAINE 2% (20 MG/ML) 5 ML SYRINGE
INTRAMUSCULAR | Status: DC | PRN
  Administered 2020-02-01: 20 mg via INTRAVENOUS

## 2020-02-01 MED ORDER — SODIUM CHLORIDE 0.9 % IV SOLN: INTRAVENOUS | Status: DC

## 2020-02-01 MED ORDER — MIDAZOLAM HCL 5 MG/5ML IJ SOLN
INTRAMUSCULAR | Status: DC | PRN
  Administered 2020-02-01: 2 mg via INTRAVENOUS

## 2020-02-01 MED ORDER — PROPOFOL 500 MG/50ML IV EMUL
INTRAVENOUS | Status: DC | PRN
  Administered 2020-02-01: 75 ug/kg/min via INTRAVENOUS

## 2020-02-01 MED ORDER — FENTANYL CITRATE (PF) 250 MCG/5ML IJ SOLN
INTRAMUSCULAR | Status: AC
  Filled 2020-02-01: qty 5

## 2020-02-01 MED ORDER — PROPOFOL 10 MG/ML IV BOLUS
INTRAVENOUS | Status: DC | PRN
  Administered 2020-02-01: 40 mg via INTRAVENOUS

## 2020-02-01 MED ORDER — FENTANYL CITRATE (PF) 100 MCG/2ML IJ SOLN
INTRAMUSCULAR | Status: AC
  Administered 2020-02-01: 100 ug via INTRAVENOUS
  Filled 2020-02-01: qty 2

## 2020-02-01 MED ORDER — C1 ESTERASE INHIBITOR (HUMAN) 500 UNITS IV KIT
1000.0000 [IU] | PACK | Freq: Once | INTRAVENOUS | Status: AC
  Administered 2020-02-01: 1000 [IU] via INTRAVENOUS
  Filled 2020-02-01: qty 1000

## 2020-02-01 MED ORDER — LIDOCAINE HCL (PF) 1 % IJ SOLN
INTRAMUSCULAR | Status: AC
  Filled 2020-02-01: qty 30

## 2020-02-01 MED ORDER — PROPOFOL 500 MG/50ML IV EMUL
INTRAVENOUS | Status: AC
  Filled 2020-02-01: qty 50

## 2020-02-01 MED ORDER — ENSURE PRE-SURGERY PO LIQD: 296.0000 mL | Freq: Once | ORAL | Status: DC

## 2020-02-01 MED ORDER — ACETAMINOPHEN 500 MG PO TABS
1000.0000 mg | ORAL_TABLET | ORAL | Status: AC
  Administered 2020-02-01: 1000 mg via ORAL
  Filled 2020-02-01: qty 2

## 2020-02-01 MED ORDER — MIDAZOLAM HCL 2 MG/2ML IJ SOLN
INTRAMUSCULAR | Status: AC
  Administered 2020-02-01: 2 mg via INTRAVENOUS
  Filled 2020-02-01: qty 2

## 2020-02-01 MED ORDER — LACTATED RINGERS IV SOLN: INTRAVENOUS | Status: DC | PRN

## 2020-02-01 MED ORDER — DEXMEDETOMIDINE HCL IN NACL 80 MCG/20ML IV SOLN
INTRAVENOUS | Status: AC
  Filled 2020-02-01: qty 20

## 2020-02-01 MED ORDER — CELECOXIB 200 MG PO CAPS
400.0000 mg | ORAL_CAPSULE | ORAL | Status: AC
  Administered 2020-02-01: 400 mg via ORAL
  Filled 2020-02-01: qty 2

## 2020-02-01 MED ORDER — BUPIVACAINE-EPINEPHRINE 0.25% -1:200000 IJ SOLN
INTRAMUSCULAR | Status: DC | PRN
  Administered 2020-02-01: 10 mL

## 2020-02-01 MED ORDER — ONDANSETRON HCL 4 MG/2ML IJ SOLN
INTRAMUSCULAR | Status: AC
  Filled 2020-02-01: qty 2

## 2020-02-01 MED ORDER — GABAPENTIN 300 MG PO CAPS
300.0000 mg | ORAL_CAPSULE | ORAL | Status: AC
  Administered 2020-02-01: 300 mg via ORAL
  Filled 2020-02-01: qty 1

## 2020-02-01 MED ORDER — CEFAZOLIN SODIUM-DEXTROSE 2-4 GM/100ML-% IV SOLN
2.0000 g | INTRAVENOUS | Status: AC
  Administered 2020-02-01: 2 g via INTRAVENOUS
  Filled 2020-02-01: qty 100

## 2020-02-01 MED ORDER — MIDAZOLAM HCL 2 MG/2ML IJ SOLN: 2.0000 mg | Freq: Once | INTRAMUSCULAR | Status: AC

## 2020-02-01 MED ORDER — PROPOFOL 10 MG/ML IV BOLUS
INTRAVENOUS | Status: AC
  Filled 2020-02-01: qty 20

## 2020-02-01 MED ORDER — MIDAZOLAM HCL 2 MG/2ML IJ SOLN
INTRAMUSCULAR | Status: AC
  Filled 2020-02-01: qty 2

## 2020-02-01 MED ORDER — 0.9 % SODIUM CHLORIDE (POUR BTL) OPTIME
TOPICAL | Status: DC | PRN
  Administered 2020-02-01: 1000 mL

## 2020-02-01 SURGICAL SUPPLY — 31 items
APPLIER CLIP 9.375 MED OPEN (MISCELLANEOUS) ×3
BINDER BREAST LRG (GAUZE/BANDAGES/DRESSINGS) ×2 IMPLANT
CANISTER SUCT 3000ML PPV (MISCELLANEOUS) ×3 IMPLANT
CHLORAPREP W/TINT 26 (MISCELLANEOUS) ×3 IMPLANT
CLIP APPLIE 9.375 MED OPEN (MISCELLANEOUS) ×1 IMPLANT
COVER PROBE W GEL 5X96 (DRAPES) ×3 IMPLANT
COVER SURGICAL LIGHT HANDLE (MISCELLANEOUS) ×3 IMPLANT
COVER WAND RF STERILE (DRAPES) ×3 IMPLANT
DERMABOND ADVANCED (GAUZE/BANDAGES/DRESSINGS) ×2
DERMABOND ADVANCED .7 DNX12 (GAUZE/BANDAGES/DRESSINGS) ×1 IMPLANT
DEVICE DUBIN SPECIMEN MAMMOGRA (MISCELLANEOUS) ×3 IMPLANT
DRAPE CHEST BREAST 15X10 FENES (DRAPES) ×3 IMPLANT
ELECT CAUTERY BLADE 6.4 (BLADE) ×3 IMPLANT
ELECT REM PT RETURN 9FT ADLT (ELECTROSURGICAL) ×3
ELECTRODE REM PT RTRN 9FT ADLT (ELECTROSURGICAL) ×1 IMPLANT
GAUZE SPONGE 4X4 12PLY STRL (GAUZE/BANDAGES/DRESSINGS) ×2 IMPLANT
GLOVE SURG SIGNA 7.5 PF LTX (GLOVE) ×3 IMPLANT
GOWN STRL REUS W/ TWL LRG LVL3 (GOWN DISPOSABLE) ×1 IMPLANT
GOWN STRL REUS W/ TWL XL LVL3 (GOWN DISPOSABLE) ×1 IMPLANT
GOWN STRL REUS W/TWL LRG LVL3 (GOWN DISPOSABLE) ×2
GOWN STRL REUS W/TWL XL LVL3 (GOWN DISPOSABLE) ×2
KIT BASIN OR (CUSTOM PROCEDURE TRAY) ×3 IMPLANT
KIT MARKER MARGIN INK (KITS) ×3 IMPLANT
NDL HYPO 25GX1X1/2 BEV (NEEDLE) ×1 IMPLANT
NEEDLE HYPO 25GX1X1/2 BEV (NEEDLE) ×3 IMPLANT
PACK GENERAL/GYN (CUSTOM PROCEDURE TRAY) ×3 IMPLANT
SUT MNCRL AB 4-0 PS2 18 (SUTURE) ×3 IMPLANT
SUT VIC AB 3-0 SH 18 (SUTURE) ×3 IMPLANT
SYR CONTROL 10ML LL (SYRINGE) ×3 IMPLANT
TOWEL GREEN STERILE (TOWEL DISPOSABLE) ×3 IMPLANT
TOWEL GREEN STERILE FF (TOWEL DISPOSABLE) ×3 IMPLANT

## 2020-02-01 NOTE — Discharge Instructions (Signed)
[facility] Office Phone Number [phone]  BREAST BIOPSY/ PARTIAL MASTECTOMY: POST OP INSTRUCTIONS  Always review your discharge instruction sheet given to you by the facility where your surgery was performed.  IF YOU HAVE DISABILITY OR FAMILY LEAVE FORMS, YOU MUST BRING THEM TO THE OFFICE FOR PROCESSING.  DO NOT GIVE THEM TO YOUR DOCTOR.  1. A prescription for pain medication may be given to you upon discharge.  Take your pain medication as prescribed, if needed.  If narcotic pain medicine is not needed, then you may take acetaminophen (Tylenol) or ibuprofen (Advil) as needed. 2. Take your usually prescribed medications unless otherwise directed 3. If you need a refill on your pain medication, please contact your pharmacy.  They will contact our office to request authorization.  Prescriptions will not be filled after 5pm or on week-ends. 4. You should eat very light the first 24 hours after surgery, such as soup, crackers, pudding, etc.  Resume your normal diet the day after surgery. 5. Most patients will experience some swelling and bruising in the breast.  Ice packs and a good support bra will help.  Swelling and bruising can take several days to resolve.  6. It is common to experience some constipation if taking pain medication after surgery.  Increasing fluid intake and taking a stool softener will usually help or prevent this problem from occurring.  A mild laxative (Milk of Magnesia or Miralax) should be taken according to package directions if there are no bowel movements after 48 hours. 7. Unless discharge instructions indicate otherwise, you may remove your bandages 24-48 hours after surgery, and you may shower at that time.  You may have steri-strips (small skin tapes) in place directly over the incision.  These strips should be left on the skin for 7-10 days.  If your surgeon used skin glue on the incision, you may shower in 24 hours.  The glue will flake off over the  next 2-3 weeks.  Any sutures or staples will be removed at the office during your follow-up visit. 8. ACTIVITIES:  You may resume regular daily activities (gradually increasing) beginning the next day.  Wearing a good support bra or sports bra minimizes pain and swelling.  You may have sexual intercourse when it is comfortable. a. You may drive when you no longer are taking prescription pain medication, you can comfortably wear a seatbelt, and you can safely maneuver your car and apply brakes. b. RETURN TO WORK:  ______________________________________________________________________________________ 9. You should see your doctor in the office for a follow-up appointment approximately two weeks after your surgery.  Your doctor's nurse will typically make your follow-up appointment when she calls you with your pathology report.  Expect your pathology report 2-3 business days after your surgery.  You may call to check if you do not hear from [place] after three days. 10. OTHER INSTRUCTIONS:OK TO REMOVE THE BREAST BINDER THIS EVENING IF YOU ARE UNCOMFORTABLE 11. OK TO SHOWER STARTING TOMORROW 12. ICE PACK, TYLENOL, IBUPROFEN ALSO FOR PAIN 13. NO VIGOROUS ACTIVITY FOR ONE WEEK _______________________________________________________________________________________________ _____________________________________________________________________________________________________________________________________ _____________________________________________________________________________________________________________________________________ _____________________________________________________________________________________________________________________________________  WHEN TO CALL YOUR DOCTOR: 1. Fever over 101.0 2. Nausea and/or vomiting. 3. Extreme swelling or bruising. 4. Continued bleeding from incision. 5. Increased pain, redness, or drainage from the incision.  The clinic staff is available to answer  your questions during regular business hours.  Please don't hesitate to call and ask to speak to one of the nurses for clinical concerns.  If you have a medical  emergency, go to the nearest emergency room or call 911.  A surgeon from [facility] is always on call at the hospital.  For further questions, please visit [email or web address]

## 2020-02-01 NOTE — Anesthesia Procedure Notes (Signed)
Procedure Name: MAC Date/Time: 02/01/2020 10:55 AM Performed by: Kyung Rudd, CRNA Pre-anesthesia Checklist: Patient identified, Emergency Drugs available, Suction available, Patient being monitored and Timeout performed Patient Re-evaluated:Patient Re-evaluated prior to induction Oxygen Delivery Method: Simple face mask Preoxygenation: Pre-oxygenation with 100% oxygen Induction Type: IV induction Placement Confirmation: positive ETCO2 Dental Injury: Teeth and Oropharynx as per pre-operative assessment

## 2020-02-01 NOTE — Interval H&P Note (Signed)
History and Physical Interval Note:no change in H and P  02/01/2020 10:04 AM  Jasmine Le  has presented today for surgery, with the diagnosis of Girard.  The various methods of treatment have been discussed with the patient and family. After consideration of risks, benefits and other options for treatment, the patient has consented to  Procedure(s) with comments: RIGHT BREAST LUMPECTOMY WITH RADIOACTIVE SEED LOCALIZATION (Right) - LMA as a surgical intervention.  The patient's history has been reviewed, patient examined, no change in status, stable for surgery.  I have reviewed the patient's chart and labs.  Questions were answered to the patient's satisfaction.     Coralie Keens

## 2020-02-01 NOTE — Anesthesia Preprocedure Evaluation (Addendum)
Anesthesia Evaluation  Patient identified by MRN, date of birth, ID band Patient awake    Reviewed: Allergy & Precautions, NPO status , Patient's Chart, lab work & pertinent test results  Airway Mallampati: I  TM Distance: >3 FB Neck ROM: Full    Dental  (+) Teeth Intact   Pulmonary Current Smoker,    breath sounds clear to auscultation       Cardiovascular  Rhythm:Regular Rate:Normal     Neuro/Psych    GI/Hepatic   Endo/Other    Renal/GU      Musculoskeletal   Abdominal   Peds  Hematology   Anesthesia Other Findings   Reproductive/Obstetrics                             Anesthesia Physical Anesthesia Plan  ASA: III  Anesthesia Plan: MAC and Regional   Post-op Pain Management:    Induction: Intravenous  PONV Risk Score and Plan: Ondansetron, Dexamethasone and Propofol infusion  Airway Management Planned: Natural Airway and Simple Face Mask  Additional Equipment:   Intra-op Plan:   Post-operative Plan:   Informed Consent: I have reviewed the patients History and Physical, chart, labs and discussed the procedure including the risks, benefits and alternatives for the proposed anesthesia with the patient or authorized representative who has indicated his/her understanding and acceptance.     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  She has never had surgery but has had previous episodes of swelling involving the hands, face, and abdominal area. She required 1 hospitalization with  ICU  admission but no intubation.  She is now scheduled to undergo Right breast lumpectomy with Dr. Ninfa Linden on 3/17.  She saw Dr. [name] who is an allergist and asthma specialist.  Dr. Maudie Mercury recommended Berinert (C1-inhibitor)   1000 units at least 1 hour before surgery and no more than 6 hours before the procedure and to have 2 additional doses on hand in case she has an angioedema attack  during or post surgery.  Plan pretreatment with Berinert and then a pectoralis block with MAC.  The additional rescue dosing of Berinert will be immediately available as well.  )       Anesthesia Quick Evaluation

## 2020-02-01 NOTE — Op Note (Addendum)
RIGHT BREAST LUMPECTOMY WITH RADIOACTIVE SEED LOCALIZATION  Procedure Note  Jasmine Le 02/01/2020   Pre-op Diagnosis: COMPLEX SCLEROSING LESION RIGHT BREAST     Post-op Diagnosis: same  Procedure(s): RIGHT BREAST LUMPECTOMY WITH RADIOACTIVE SEED LOCALIZATION  Surgeon(s): Coralie Keens, MD  Anesthesia: General  Staff:  Circulator: Marliss Czar, RN Scrub Person: Merian Capron Circulator Assistant: Johnanna Schneiders, RN  Estimated Blood Loss: Minimal               Specimens: sent to path  Indications: This is a 44 year old female who was found to have an abnormality in the right breast on recent mammography.  She underwent a stereotactic biopsy showing a complex sclerosing lesion.  The decision was made to proceed with a radioactive seed guided right breast lumpectomy  Procedure: The patient was brought to the operating room and identifies correct patient.  She is placed in a supine position on the operating room table and anesthesia was induced.  She had received a pack block preoperatively.  Her right breast was prepped and draped in usual sterile fashion.  Identified the seed near the 12 o'clock position approximately 4 to 5 cm from the nipple areolar complex with the neoprobe.  I anesthetized the skin at the edge of the areola with lidocaine and then made a circumareolar incision with a scalpel.  I then used the cautery to dissect superiorly toward the radioactive seed with aid of the neoprobe.  Once identified area of seed I then performed a lumpectomy with the cautery staying around the seed.  Once the lumpectomy specimen was removed I again confirmed that the seed was in the specimen with the neoprobe.  Margins were marked with pain.  The specimen was x-rayed and the previous we placed marker and radioactive seeds were seen in the lumpectomy specimen.  This was then sent to pathology for evaluation.  Hemostasis was achieved with the cautery.  I then closed the subcutaneous  tissue with interrupted 3-0 Vicryl sutures and closed the skin with a running 4-0 Monocryl.  Dermabond and a breast binder were then applied.  The patient tolerated the procedure well.  All the counts were correct at the end of the procedure.  The patient was then taken in a stable condition from the operating room to the recovery room.            Coralie Keens   Date: 02/01/2020  Time: 11:31 AM

## 2020-02-01 NOTE — Anesthesia Postprocedure Evaluation (Signed)
Anesthesia Post Note  Patient: Abi Baas  Procedure(s) Performed: RIGHT BREAST LUMPECTOMY WITH RADIOACTIVE SEED LOCALIZATION (Right Breast)     Patient location during evaluation: PACU Anesthesia Type: Regional Level of consciousness: awake and alert Pain management: pain level controlled Vital Signs Assessment: post-procedure vital signs reviewed and stable Respiratory status: spontaneous breathing, nonlabored ventilation, respiratory function stable and patient connected to nasal cannula oxygen Cardiovascular status: blood pressure returned to baseline and stable Postop Assessment: no apparent nausea or vomiting Anesthetic complications: no    Last Vitals:  Vitals:   02/01/20 1200 02/01/20 1209  BP: 106/70 (!) 142/71  Pulse: 66 72  Resp: 16 13  Temp: 36.9 C 36.9 C  SpO2: 98% 99%    Last Pain:  Vitals:   02/01/20 1200  PainSc: 0-No pain                 Fay Swider COKER

## 2020-02-01 NOTE — Transfer of Care (Signed)
Immediate Anesthesia Transfer of Care Note  Patient: Jasmine Le  Procedure(s) Performed: RIGHT BREAST LUMPECTOMY WITH RADIOACTIVE SEED LOCALIZATION (Right Breast)  Patient Location: PACU  Anesthesia Type:MAC combined with regional for post-op pain  Level of Consciousness: awake, alert  and oriented  Airway & Oxygen Therapy: Patient Spontanous Breathing and Patient connected to face mask oxygen  Post-op Assessment: Report given to RN, Post -op Vital signs reviewed and stable and Patient moving all extremities X 4  Post vital signs: Reviewed and stable  Last Vitals:  Vitals Value Taken Time  BP 105/57 02/01/20 1138  Temp    Pulse 80 02/01/20 1140  Resp 17 02/01/20 1140  SpO2 98 % 02/01/20 1140  Vitals shown include unvalidated device data.  Last Pain:  Vitals:   02/01/20 1020  PainSc: 0-No pain      Patients Stated Pain Goal: 0 (35/[id number]/51 [id number])  Complications: No apparent anesthesia complications

## 2020-02-01 NOTE — Anesthesia Procedure Notes (Signed)
Anesthesia Regional Block: Pectoralis block   Pre-Anesthetic Checklist: ,, timeout performed, Correct Patient, Correct Site, Correct Laterality, Correct Procedure, Correct Position, site marked, Risks and benefits discussed,  Surgical consent,  Pre-op evaluation,  At surgeon's request and post-op pain management  Laterality: Right  Prep: chloraprep       Needles:  Injection technique: Single-shot  Needle Type: Echogenic Needle     Needle Length: 9cm  Needle Gauge: 21     Additional Needles:   Narrative:  Start time: 02/01/2020 10:15 AM End time: 02/01/2020 10:20 AM Injection made incrementally with aspirations every 5 mL.  Performed by: Personally  Anesthesiologist: Roberts Gaudy, MD  Additional Notes: 30 cc 0.5% Bupivacaine with 1:200 epi injected easily

## 2020-02-03 LAB — SURGICAL PATHOLOGY

## 2020-03-28 ENCOUNTER — Ambulatory Visit: Payer: BC Managed Care – PPO | Admitting: Allergy

## 2020-04-09 ENCOUNTER — Encounter: Payer: Self-pay | Admitting: Allergy

## 2020-04-09 ENCOUNTER — Other Ambulatory Visit: Payer: Self-pay

## 2020-04-09 ENCOUNTER — Ambulatory Visit (INDEPENDENT_AMBULATORY_CARE_PROVIDER_SITE_OTHER): Payer: BC Managed Care – PPO | Admitting: Allergy

## 2020-04-09 VITALS — BP 152/100 | HR 104 | Temp 98.4°F | Resp 17 | Ht 65.0 in | Wt 144.4 lb

## 2020-04-09 DIAGNOSIS — D841 Defects in the complement system: Secondary | ICD-10-CM

## 2020-04-09 DIAGNOSIS — R030 Elevated blood-pressure reading, without diagnosis of hypertension: Secondary | ICD-10-CM

## 2020-04-09 NOTE — Progress Notes (Signed)
Follow Up Note  RE: Jasmine Le MRN: [id number] DOB: 24-Mar-1976 Date of Office Visit: 04/09/2020  Referring provider: No ref. provider found Primary care provider: Patient, No Pcp Per  Chief Complaint: Follow-up  History of Present Illness: I had the pleasure of seeing Jasmine Le for a follow up visit at the [facility] [place] of  on 04/10/2020. She is a 44 y.o. female, who is being followed for hereditary angioedema. Her previous allergy office visit was on 12/29/2019 with Dr. Maudie Mercury. Today is a regular follow up visit. Up to date with COVID-19 vaccine: yes - [place].   Hereditary angioedema [facility]) Patient had breast lumpectomy with pre-treatment with Berinert with no issues on 02/01/2020.  Patient has Firazyr at home and has not needed to use it.  She hasn't had any major episodes however she had 3 smaller swelling episodes.  2 episodes of left foot swelling which she believes was due to a pair of shoes that were tight and had 1 episode of genital swelling after she was riding on a lawn mower in the hot weather.   All 3 episodes resolved within 1 day with no medications.   She still does not have a PCP and her BP readings at other physician's office were normal to her knowledge.  Assessment and Plan: Jasmine Le is a 44 y.o. female with: Hereditary angioedema ([place]) Past history - Diagnosed with HAE in her 35s in [place] after having frequent bouts of angioedema of her face, feet, hands, abdominal area. One episode of laryngeal swelling requiring ICU stay without intubation. Since her initially diagnosis, severe attacks have diminished and last ER visit was in 2019 requiring Berinert. However, she does have milder angioedema of hands and feet throughout the year. No previous prophylactic medications and does not have acute medicine on hand either. Previously she had prophylactic IV medications prior to an oral surgery 5-10 years ago and did well. No other family members with HAE.   Interim history - patient had breast lumpectomy on 02/01/20 with Berinert pretreatment with good benefit. 3 mild swelling since last visit - 2 in foot, 1 in genital area. All 3 resolved within 1 day with no medications. Has Firazyr on hand at home.   Please keep track of attacks even mild ones.  We will discuss preventative medications if having frequent attacks - patient declines at this time.   Use Firazyr for acute attacks and go to ER after administration.   Patient will need prophylactic treatment 1 hour prior to any dental procedures - Berinert and Ruconest would be 2 good options for her which can be self-administered at home via IV.   Elevated blood pressure reading Strongly advised patient to establish care with PCP and get her blood pressure checked out. She states that at her post op visit, her blood pressure was normal. Blood pressure elevated at today's visit.   Return in about 6 months (around 10/10/2020).  Diagnostics: None.  Medication List:  Current Outpatient Medications  Medication Sig Dispense Refill  . fexofenadine (ALLEGRA) 180 MG tablet Take 180 mg by mouth daily.    Marland Kitchen levonorgestrel (MIRENA) 20 MCG/24HR IUD 1 each by Intrauterine route once.     . Multiple Vitamin (MULTIVITAMIN WITH MINERALS) TABS tablet Take 3 tablets by mouth daily.    . traMADol (ULTRAM) 50 MG tablet Take 1 tablet (50 mg total) by mouth every 6 (six) hours as needed for moderate pain or severe pain. (Patient not taking: Reported on 04/09/2020) 20 tablet  0   No current facility-administered medications for this visit.   Facility-Administered Medications Ordered in Other Visits  Medication Dose Route Frequency Provider Last Rate Last Admin  . 0.9 %  sodium chloride infusion   Intravenous Continuous Coralie Keens, MD      . C1 esterase inhibitor (Human) (BERINERT) injection 1,000 Units  1,000 Units Intravenous Once Coralie Keens, MD       Allergies: Allergies  Allergen Reactions  .  Thiethylperazine Anaphylaxis  . Ace Inhibitors Swelling  . Erythromycin Nausea And Vomiting  . Ibuprofen Nausea And Vomiting    In high doses   I reviewed her past medical history, social history, family history, and environmental history and no significant changes have been reported from her previous visit.  Review of Systems  Constitutional: Negative for appetite change, chills, fever and unexpected weight change.  HENT: Negative for congestion and rhinorrhea.   Eyes: Negative for itching.  Respiratory: Negative for cough, chest tightness, shortness of breath and wheezing.   Cardiovascular: Negative for chest pain.  Gastrointestinal: Negative for abdominal pain.  Genitourinary: Negative for difficulty urinating.  Skin: Negative for rash.  Allergic/Immunologic: Negative for environmental allergies and food allergies.  Neurological: Negative for headaches.   Objective: BP (!) 152/100 (BP Location: Right Arm, Patient Position: Sitting, Cuff Size: Normal)   Pulse (!) 104   Temp 98.4 F (36.9 C) (Temporal)   Resp 17   Ht 5\' 5"  (1.651 m)   Wt 144 lb 6.4 oz (65.5 kg)   SpO2 97%   BMI 24.03 kg/m  Body mass index is 24.03 kg/m. Physical Exam  Constitutional: She is oriented to person, place, and time. She appears well-developed and well-nourished.  HENT:  Head: Normocephalic and atraumatic.  Right Ear: External ear normal.  Left Ear: External ear normal.  Nose: Nose normal.  Mouth/Throat: Oropharynx is clear and moist.  Eyes: Conjunctivae and EOM are normal.  Cardiovascular: Normal rate, regular rhythm and normal heart sounds. Exam reveals no gallop and no friction rub.  No murmur heard. Pulmonary/Chest: Effort normal and breath sounds normal. She has no wheezes. She has no rales.  Abdominal: Soft.  Musculoskeletal:     Cervical back: Neck supple.  Neurological: She is alert and oriented to person, place, and time.  Skin: Skin is warm. No rash noted.  Psychiatric: She has a  normal mood and affect. Her behavior is normal.  Nursing note and vitals reviewed.  Previous notes and tests were reviewed. The plan was reviewed with the patient/family, and all questions/concerned were addressed.  It was my pleasure to see Jasmine Le today and participate in her care. Please feel free to contact me with any questions or concerns.  Sincerely,  [name], DO Allergy & Immunology  [facility] office: [phone] [facility] office: [place] office: [phone]

## 2020-04-09 NOTE — Patient Instructions (Addendum)
   Please keep track of your attacks even mild ones.  We will discuss preventative medications if having frequent attacks.   Use Windy Canny for acute attacks and go to ER after using  I'll let you know what do prior to dental procedures.  Follow up in 6 months or sooner if needed.  Please follow up with PCP regarding your blood pressure.

## 2020-04-10 ENCOUNTER — Encounter: Payer: Self-pay | Admitting: Allergy

## 2020-04-10 NOTE — Assessment & Plan Note (Signed)
Past history - Diagnosed with HAE in her 27s in [place] after having frequent bouts of angioedema of her face, feet, hands, abdominal area. One episode of laryngeal swelling requiring ICU stay without intubation. Since her initially diagnosis, severe attacks have diminished and last ER visit was in 2019 requiring Berinert. However, she does have milder angioedema of hands and feet throughout the year. No previous prophylactic medications and does not have acute medicine on hand either. Previously she had prophylactic IV medications prior to an oral surgery 5-10 years ago and did well. No other family members with HAE.  Interim history - patient had breast lumpectomy on 02/01/20 with Berinert pretreatment with good benefit. 3 mild swelling since last visit - 2 in foot, 1 in genital area. All 3 resolved within 1 day with no medications. Has Firazyr on hand at home.   Please keep track of attacks even mild ones.  We will discuss preventative medications if having frequent attacks - patient declines at this time.   Use Firazyr for acute attacks and go to ER after administration.   Patient will need prophylactic treatment 1 hour prior to any dental procedures - Berinert and Ruconest would be 2 good options for her which can be self-administered at home via IV.

## 2020-04-10 NOTE — Assessment & Plan Note (Signed)
Strongly advised patient to establish care with PCP and get her blood pressure checked out. She states that at her post op visit, her blood pressure was normal. Blood pressure elevated at today's visit.

## 2020-08-15 ENCOUNTER — Encounter: Payer: Self-pay | Admitting: Allergy

## 2020-08-15 DIAGNOSIS — Z23 Encounter for immunization: Secondary | ICD-10-CM | POA: Diagnosis not present

## 2020-08-15 DIAGNOSIS — R8290 Unspecified abnormal findings in urine: Secondary | ICD-10-CM | POA: Diagnosis not present

## 2020-08-15 DIAGNOSIS — I10 Essential (primary) hypertension: Secondary | ICD-10-CM | POA: Diagnosis not present

## 2020-09-20 DIAGNOSIS — I10 Essential (primary) hypertension: Secondary | ICD-10-CM | POA: Diagnosis not present

## 2020-10-15 ENCOUNTER — Encounter: Payer: Self-pay | Admitting: Allergy

## 2020-10-15 ENCOUNTER — Ambulatory Visit (INDEPENDENT_AMBULATORY_CARE_PROVIDER_SITE_OTHER): Payer: BC Managed Care – PPO | Admitting: Allergy

## 2020-10-15 ENCOUNTER — Other Ambulatory Visit: Payer: Self-pay

## 2020-10-15 VITALS — BP 142/90 | HR 98 | Temp 98.6°F | Resp 18 | Ht 65.0 in | Wt 140.2 lb

## 2020-10-15 DIAGNOSIS — R030 Elevated blood-pressure reading, without diagnosis of hypertension: Secondary | ICD-10-CM

## 2020-10-15 DIAGNOSIS — D841 Defects in the complement system: Secondary | ICD-10-CM

## 2020-10-15 NOTE — Assessment & Plan Note (Signed)
   Patient being followed by PCP for this.

## 2020-10-15 NOTE — Progress Notes (Signed)
Follow Up Note  RE: Jasmine Le MRN: [id number] DOB: [birth year] Date of Office Visit: 10/15/2020  Referring provider: No ref. provider found Primary care provider: Michael Boston, MD  Chief Complaint: Angioedema  History of Present Illness: I had the pleasure of seeing Jasmine Le for a follow up visit at the [facility] [place] of [place] on 10/15/2020. She is a 44 y.o. female, who is being followed for hereditary angioedema. Her previous allergy office visit was on 04/09/2020 with Dr. Maudie Mercury. Today is a regular follow up visit.  Hereditary angioedema Patient had 1 flare a few weeks ago (10/25) with feet swelling and symptoms resolved within a few days. Patient did not take any medications for this. She is not sure what triggered this episode but was stressed during this time.   No dental work/cleaning since the last visit - still hesitant.   Elevated blood pressure reading Being managed by PCP.  Assessment and Plan: Jasmine Le is a 44 y.o. female with: Hereditary angioedema (Jasmine Le) Past history - Diagnosed with HAE in her 7s in [place] after having frequent bouts of angioedema of her face, feet, hands, abdominal area. One episode of laryngeal swelling requiring ICU stay without intubation. Since her initially diagnosis, severe attacks have diminished and last ER visit was in 2019 requiring Berinert. However, she does have milder angioedema of hands and feet throughout the year. No previous prophylactic medications and does not have acute medicine on hand either. Previously she had prophylactic IV medications prior to an oral surgery 5-10 years ago and did well. No other family members with HAE.  Interim history - one flare in October with bilateral feet swelling. Patient did not take any medications for this. May have been triggered due to stress.   Please keep track of your attacks even mild ones.  Consider starting Orladeyo - 1 pill orally daily for preventative measures.  If you  want to start before our next appointment, please call or send a mychart message.  Handout given - discussed risks and benefits.   Use Windy Canny for acute attacks and go to ER after using the medication.   If you need a refill then let us know.   Get established with dentist at [facility] or [facility] for routine cleaning needs.  Make sure they are aware of your HAE diagnosis.   Patient may need prophylactic treatment 1 hour prior to any dental procedures (other than routine cleaning) - Berinert and Ruconest would be 2 good options for her which can be self-administered at home via IV.   Elevated blood pressure reading  Patient being followed by PCP for this.   Return in about 6 months (around 04/14/2021).  Diagnostics: None.  Medication List:  Current Outpatient Medications  Medication Sig Dispense Refill  . amLODipine (NORVASC) 5 MG tablet Take 5 mg by mouth daily.     . fexofenadine (ALLEGRA) 180 MG tablet Take 180 mg by mouth daily.    Marland Kitchen levonorgestrel (MIRENA) 20 MCG/24HR IUD 1 each by Intrauterine route once.     . Multiple Vitamin (MULTIVITAMIN WITH MINERALS) TABS tablet Take 3 tablets by mouth daily.     No current facility-administered medications for this visit.   Facility-Administered Medications Ordered in Other Visits  Medication Dose Route Frequency Provider Last Rate Last Admin  . 0.9 %  sodium chloride infusion   Intravenous Continuous Coralie Keens, MD      . C1 esterase inhibitor (Human) (BERINERT) injection 1,000 Units  1,000 Units Intravenous Once  Coralie Keens, MD       Allergies: Allergies  Allergen Reactions  . Thiethylperazine Anaphylaxis  . Ace Inhibitors Swelling  . Erythromycin Nausea And Vomiting  . Ibuprofen Nausea And Vomiting    In high doses   I reviewed her past medical history, social history, family history, and environmental history and no significant changes have been reported from her previous visit.  Review of Systems  Constitutional:  Negative for appetite change, chills, fever and unexpected weight change.  HENT: Negative for congestion and rhinorrhea.   Eyes: Negative for itching.  Respiratory: Negative for cough, chest tightness, shortness of breath and wheezing.   Cardiovascular: Negative for chest pain.  Gastrointestinal: Negative for abdominal pain.  Genitourinary: Negative for difficulty urinating.  Skin: Negative for rash.  Allergic/Immunologic: Negative for environmental allergies and food allergies.  Neurological: Negative for headaches.   Objective: BP (!) 142/90   Pulse 98   Temp 98.6 F (37 C) (Temporal)   Resp 18   Ht 5\' 5"  (1.651 m)   Wt 140 lb 3.2 oz (63.6 kg)   SpO2 98%   BMI 23.33 kg/m  Body mass index is 23.33 kg/m. Physical Exam Vitals and nursing note reviewed.  Constitutional:      Appearance: She is well-developed.  HENT:     Head: Normocephalic and atraumatic.     Right Ear: External ear normal.     Left Ear: External ear normal.     Nose: Nose normal.  Eyes:     Conjunctiva/sclera: Conjunctivae normal.  Cardiovascular:     Rate and Rhythm: Normal rate and regular rhythm.     Heart sounds: Normal heart sounds. No murmur heard.  No friction rub. No gallop.   Pulmonary:     Effort: Pulmonary effort is normal.     Breath sounds: Normal breath sounds. No wheezing or rales.  Abdominal:     Palpations: Abdomen is soft.  Musculoskeletal:     Cervical back: Neck supple.  Skin:    General: Skin is warm.     Findings: No rash.  Neurological:     Mental Status: She is alert and oriented to person, place, and time.  Psychiatric:        Behavior: Behavior normal.    Previous notes and tests were reviewed. The plan was reviewed with the patient/family, and all questions/concerned were addressed.  It was my pleasure to see Jasmine Le today and participate in her care. Please feel free to contact me with any questions or concerns.  Sincerely,  [name], DO Allergy &  Immunology  [facility] office: [place] office: [phone]

## 2020-10-15 NOTE — Assessment & Plan Note (Signed)
Past history - Diagnosed with HAE in her 54s in [place] after having frequent bouts of angioedema of her face, feet, hands, abdominal area. One episode of laryngeal swelling requiring ICU stay without intubation. Since her initially diagnosis, severe attacks have diminished and last ER visit was in 2019 requiring Berinert. However, she does have milder angioedema of hands and feet throughout the year. No previous prophylactic medications and does not have acute medicine on hand either. Previously she had prophylactic IV medications prior to an oral surgery 5-10 years ago and did well. No other family members with HAE.  Interim history - one flare in October with bilateral feet swelling. Patient did not take any medications for this. May have been triggered due to stress.   Please keep track of your attacks even mild ones.  Consider starting Orladeyo - 1 pill orally daily for preventative measures.  If you want to start before our next appointment, please call or send a mychart message.  Handout given - discussed risks and benefits.   Use Windy Canny for acute attacks and go to ER after using the medication.   If you need a refill then let us know.   Get established with dentist at [facility] or [facility] for routine cleaning needs.  Make sure they are aware of your HAE diagnosis.   Patient may need prophylactic treatment 1 hour prior to any dental procedures (other than routine cleaning) - Berinert and Ruconest would be 2 good options for her which can be self-administered at home via IV.

## 2020-10-15 NOTE — Patient Instructions (Addendum)
   Please keep track of your attacks even mild ones.  Consider starting Orladeyo - 1 pill orally daily for preventative measures.  If you want to start before our next appointment, please call or send a mychart message and I will put order in.   Use Windy Canny for acute attacks and go to ER after using the medication.   If you need a refill then let us know.   Get established with dentist at [facility] or [facility] for routine cleaning needs.  Make sure they are aware of your HAE diagnosis.   Follow up in 6 months or sooner if needed.   Please follow up with PCP regarding your blood pressure.

## 2020-10-22 DIAGNOSIS — H40053 Ocular hypertension, bilateral: Secondary | ICD-10-CM | POA: Diagnosis not present

## 2020-11-05 DIAGNOSIS — Z975 Presence of (intrauterine) contraceptive device: Secondary | ICD-10-CM | POA: Diagnosis not present

## 2020-11-05 DIAGNOSIS — Z124 Encounter for screening for malignant neoplasm of cervix: Secondary | ICD-10-CM | POA: Diagnosis not present

## 2020-11-05 DIAGNOSIS — Z1151 Encounter for screening for human papillomavirus (HPV): Secondary | ICD-10-CM | POA: Diagnosis not present

## 2020-11-05 DIAGNOSIS — Z01419 Encounter for gynecological examination (general) (routine) without abnormal findings: Secondary | ICD-10-CM | POA: Diagnosis not present

## 2020-12-13 DIAGNOSIS — I10 Essential (primary) hypertension: Secondary | ICD-10-CM | POA: Diagnosis not present

## 2020-12-20 DIAGNOSIS — Z1331 Encounter for screening for depression: Secondary | ICD-10-CM | POA: Diagnosis not present

## 2020-12-20 DIAGNOSIS — I10 Essential (primary) hypertension: Secondary | ICD-10-CM | POA: Diagnosis not present

## 2020-12-20 DIAGNOSIS — D841 Defects in the complement system: Secondary | ICD-10-CM | POA: Diagnosis not present

## 2020-12-20 DIAGNOSIS — Z0000 Encounter for general adult medical examination without abnormal findings: Secondary | ICD-10-CM | POA: Diagnosis not present

## 2020-12-20 DIAGNOSIS — Z23 Encounter for immunization: Secondary | ICD-10-CM | POA: Diagnosis not present

## 2021-01-26 ENCOUNTER — Encounter (HOSPITAL_COMMUNITY): Payer: Self-pay

## 2021-04-16 NOTE — Progress Notes (Signed)
Follow Up Note  RE: Jasmine Le MRN: [id number] DOB: 20-Jun-1976 Date of Office Visit: 04/17/2021  Referring provider: Michael Boston, MD Primary care provider: Michael Boston, MD  Chief Complaint: Angioedema (Doing well, no problems)  History of Present Illness: I had the pleasure of seeing Jasmine Le for a follow up visit at the [facility] and [place] of [place] on 04/17/2021. She is a 45 y.o. female, who is being followed for hereditary angioedema. Her previous allergy office visit was on 10/15/2020 with Dr. Maudie Mercury. Today is a regular follow up visit.  Hereditary angioedema  Last week patient had a minor episode on her right face which lasted about 24 hours - this was before the dentist appointment.  No other episodes.  Patient went to the dentist last week - [facility] as a consultation. She does require some deep cleanings with localized numbing - this is scheduled in October.   Patient has Firazyr at home which she did not need to use.   Patient is due for colonoscopy as well.  Patient is on medication for hypertension now.  Assessment and Plan: Alysse is a 45 y.o. female with: Hereditary angioedema ([place]) Past history - Diagnosed with HAE in her 71s in [place] after having frequent bouts of angioedema of her face, feet, hands, abdominal area. One episode of laryngeal swelling requiring ICU stay without intubation. Since her initially diagnosis, severe attacks have diminished and last ER visit was in 2019 requiring Berinert. However, she does have milder angioedema of hands and feet throughout the year. No previous prophylactic medications and does not have acute medicine on hand either. Previously she had prophylactic IV medications prior to an oral surgery 5-10 years ago and did well. No other family members with HAE.  Interim history - one small episode on the face last week prior to going to see the dentist. Resolved without any intervention within 24 hours.  Please  keep track of your attacks even mild ones.  Start Orladeyo 150mg  - 1 pill orally daily for preventative measures.  If you have any issues with this let us know.   Use Firazyr for acute attacks and go to ER after using the medication.   Make sure you take it your dentist appointments and have someone else drive you there.   If you get a colonoscopy, recommend getting the first one at a hospital setting and NOT at an outpatient surgical center in case you have an acute HAE attack.  Make sure you take your Firazyr as well.  Return in about 5 months (around 09/17/2021).  No orders of the defined types were placed in this encounter.  Lab Orders  No laboratory test(s) ordered today    Diagnostics: None.  Medication List:  Current Outpatient Medications  Medication Sig Dispense Refill  . amLODipine (NORVASC) 5 MG tablet Take 5 mg by mouth daily.     . fexofenadine (ALLEGRA) 180 MG tablet Take 180 mg by mouth daily.    Marland Kitchen icatibant (FIRAZYR) 30 MG/3ML injection Inject into the skin as needed.    Marland Kitchen levonorgestrel (MIRENA) 20 MCG/24HR IUD 1 each by Intrauterine route once.     . Multiple Vitamin (MULTIVITAMIN WITH MINERALS) TABS tablet Take 3 tablets by mouth daily.    . Multiple Vitamin (MULTIVITAMIN) capsule Take 1 capsule by mouth daily.     No current facility-administered medications for this visit.   Facility-Administered Medications Ordered in Other Visits  Medication Dose Route Frequency Provider Last Rate Last  Admin  . 0.9 %  sodium chloride infusion   Intravenous Continuous Coralie Keens, MD      . C1 esterase inhibitor (Human) (BERINERT) injection 1,000 Units  1,000 Units Intravenous Once Coralie Keens, MD       Allergies: Allergies  Allergen Reactions  . Thiethylperazine Anaphylaxis  . Ace Inhibitors Swelling  . Erythromycin Nausea And Vomiting  . Ibuprofen Nausea And Vomiting    In high doses   I reviewed her past medical history, social history, family  history, and environmental history and no significant changes have been reported from her previous visit.  Review of Systems  Constitutional: Negative for appetite change, chills, fever and unexpected weight change.  HENT: Negative for congestion and rhinorrhea.   Eyes: Negative for itching.  Respiratory: Negative for cough, chest tightness, shortness of breath and wheezing.   Cardiovascular: Negative for chest pain.  Gastrointestinal: Negative for abdominal pain.  Genitourinary: Negative for difficulty urinating.  Skin: Negative for rash.  Allergic/Immunologic: Negative for food allergies.  Neurological: Negative for headaches.   Objective: BP 128/84   Pulse 78   Temp 98.5 F (36.9 C) (Temporal)   Resp 16   SpO2 98%  There is no height or weight on file to calculate BMI. Physical Exam Vitals and nursing note reviewed.  Constitutional:      Appearance: She is well-developed.  HENT:     Head: Normocephalic and atraumatic.     Right Ear: Tympanic membrane and external ear normal.     Left Ear: Tympanic membrane and external ear normal.     Nose: Nose normal.  Eyes:     Conjunctiva/sclera: Conjunctivae normal.  Cardiovascular:     Rate and Rhythm: Normal rate and regular rhythm.     Heart sounds: Normal heart sounds. No murmur heard. No friction rub. No gallop.   Pulmonary:     Effort: Pulmonary effort is normal.     Breath sounds: Normal breath sounds. No wheezing or rales.  Musculoskeletal:     Cervical back: Neck supple.  Skin:    General: Skin is warm.     Findings: No rash.  Neurological:     Mental Status: She is alert and oriented to person, place, and time.  Psychiatric:        Behavior: Behavior normal.    Previous notes and tests were reviewed. The plan was reviewed with the patient/family, and all questions/concerned were addressed.  It was my pleasure to see Jasmine Le today and participate in her care. Please feel free to contact me with any questions or  concerns.  Sincerely,  [name], DO Allergy & Immunology  [facility] office: [place] office: [phone]

## 2021-04-17 ENCOUNTER — Ambulatory Visit (INDEPENDENT_AMBULATORY_CARE_PROVIDER_SITE_OTHER): Payer: BC Managed Care – PPO | Admitting: Allergy

## 2021-04-17 ENCOUNTER — Other Ambulatory Visit: Payer: Self-pay

## 2021-04-17 ENCOUNTER — Encounter: Payer: Self-pay | Admitting: Allergy

## 2021-04-17 VITALS — BP 128/84 | HR 78 | Temp 98.5°F | Resp 16

## 2021-04-17 DIAGNOSIS — D841 Defects in the complement system: Secondary | ICD-10-CM | POA: Diagnosis not present

## 2021-04-17 NOTE — Patient Instructions (Addendum)
   Please keep track of your attacks even mild ones.  Start Orladeyo - 1 pill orally daily for preventative measures.  Tammy will be in touch with you.   If you have any issues with this let us know.   Use Windy Canny for acute attacks and go to ER after using the medication.   If you need a refill then let us know.   Make sure you take it your dentist appointments and have someone else drive you there.   If you get a colonoscopy, recommend getting the first one at a hospital setting and NOT at an outpatient surgical center in case you have a large reaction.  Make sure you take your Firazyr as well.  Follow up in 5 months or sooner if needed.

## 2021-04-17 NOTE — Assessment & Plan Note (Signed)
Past history - Diagnosed with HAE in her 69s in [place] after having frequent bouts of angioedema of her face, feet, hands, abdominal area. One episode of laryngeal swelling requiring ICU stay without intubation. Since her initially diagnosis, severe attacks have diminished and last ER visit was in 2019 requiring Berinert. However, she does have milder angioedema of hands and feet throughout the year. No previous prophylactic medications and does not have acute medicine on hand either. Previously she had prophylactic IV medications prior to an oral surgery 5-10 years ago and did well. No other family members with HAE.  Interim history - one small episode on the face last week prior to going to see the dentist. Resolved without any intervention within 24 hours.  Please keep track of your attacks even mild ones.  Start Orladeyo 150mg  - 1 pill orally daily for preventative measures.  If you have any issues with this let us know.   Use Firazyr for acute attacks and go to ER after using the medication.   Make sure you take it your dentist appointments and have someone else drive you there.   If you get a colonoscopy, recommend getting the first one at a hospital setting and NOT at an outpatient surgical center in case you have an acute HAE attack.  Make sure you take your Firazyr as well.

## 2021-04-18 ENCOUNTER — Telehealth: Payer: Self-pay | Admitting: *Deleted

## 2021-04-18 NOTE — Telephone Encounter (Signed)
Called patient and advised approval for Orladeyo and submit to [facility] for copay. They will send to their designated pharmacy for drug [place] for dispense their number is [phone]

## 2021-04-18 NOTE — Telephone Encounter (Signed)
-----   Message from [name], DO sent at 04/17/2021 12:26 PM EDT ----- Please start PA for Orladeyo 150mg  1 pill once a day for HAE. Thank you.

## 2021-06-18 IMAGING — MG DIGITAL SCREENING BILAT W/ TOMO W/ CAD
6 of 10 series · 6 of 30 positions shown · non-contrast
Comparison: Previous exam(s).

CLINICAL DATA: Screening.

EXAM:
DIGITAL SCREENING BILATERAL MAMMOGRAM WITH TOMO AND CAD

[L CC synth-2D]
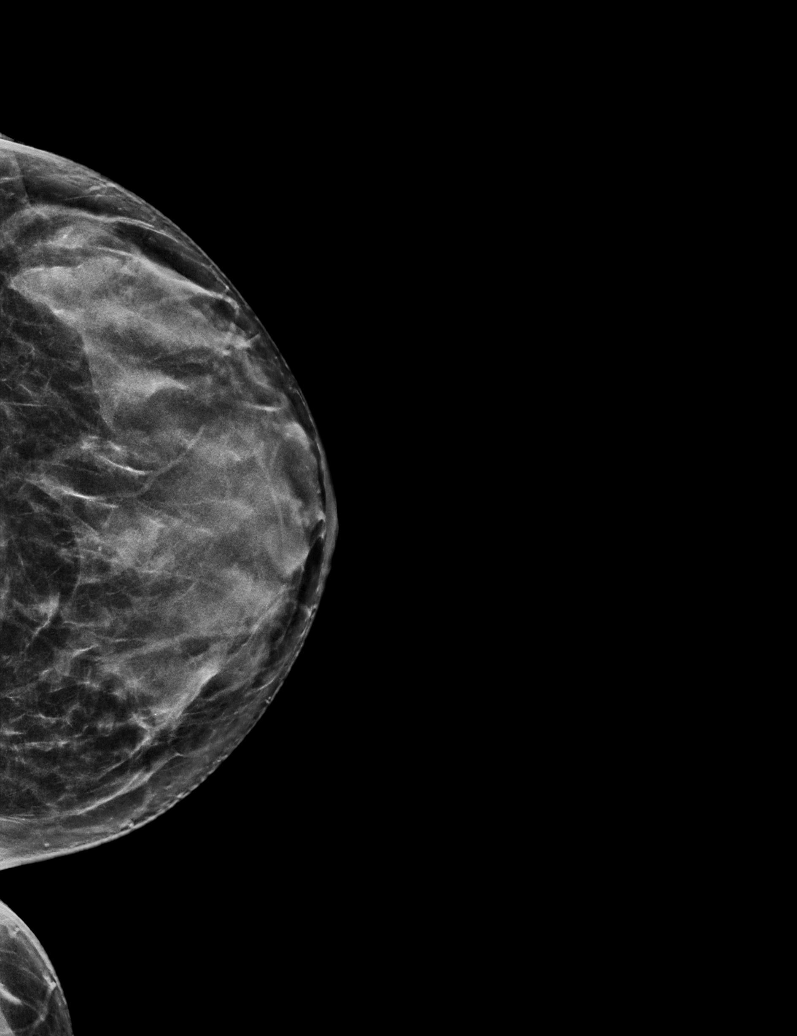

[L MLO synth-2D (1 of 2)]
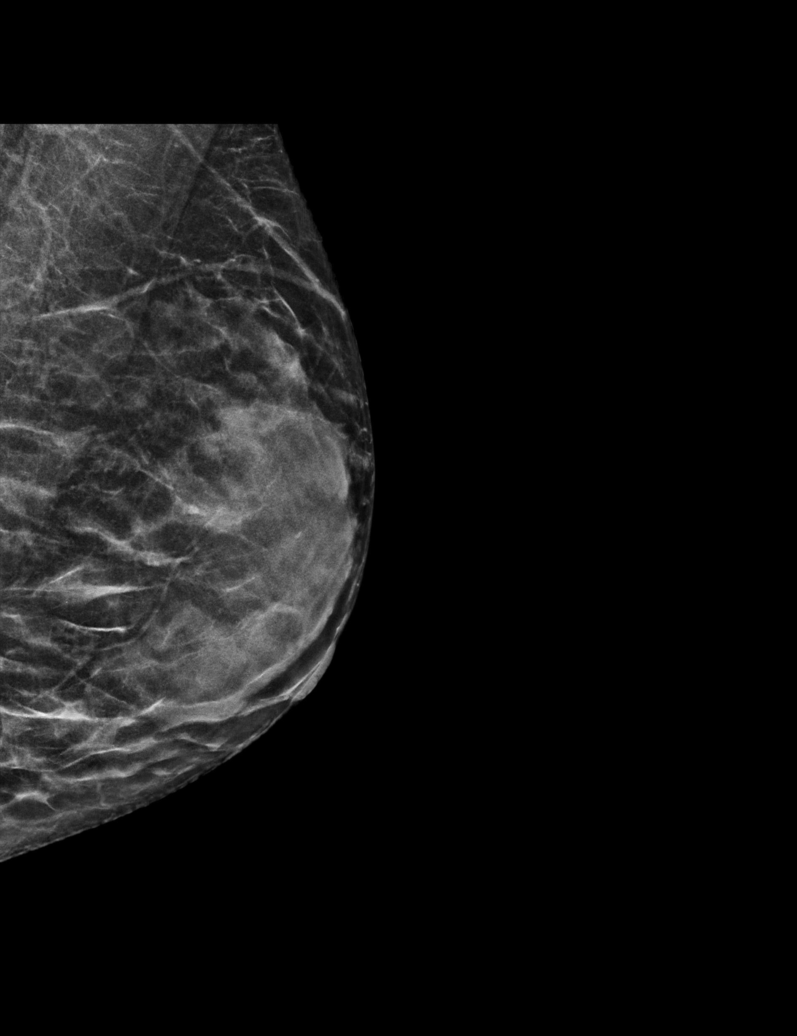

[R CC synth-2D]
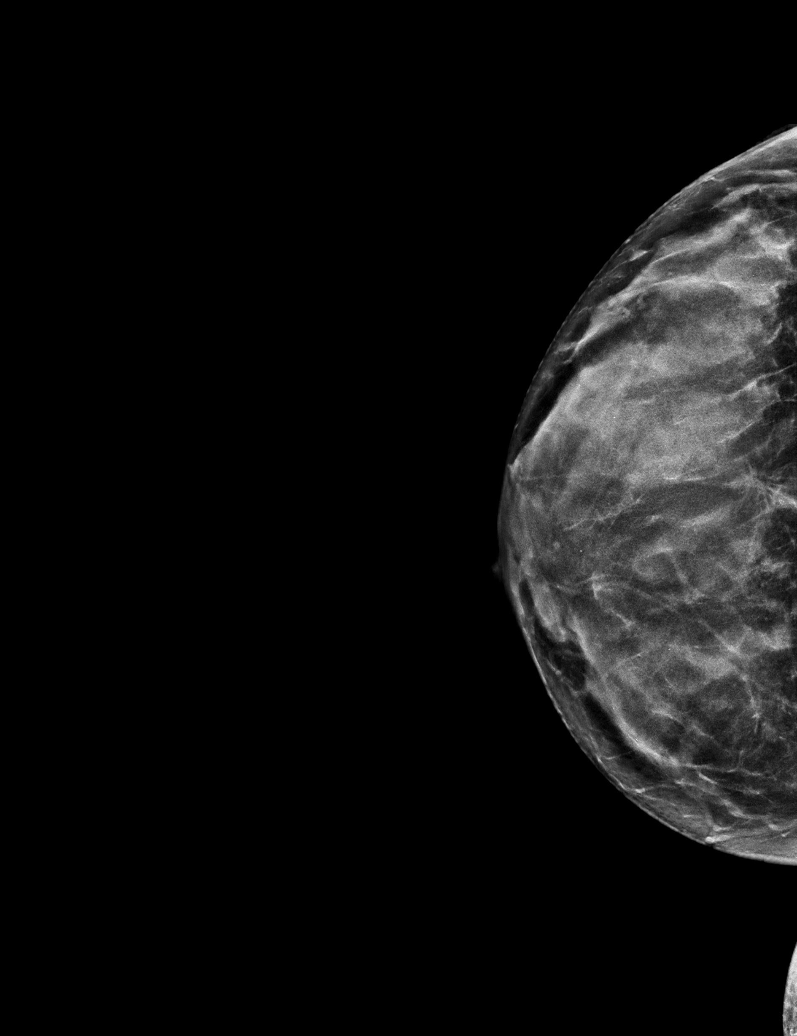

[L MLO synth-2D (2 of 2)]
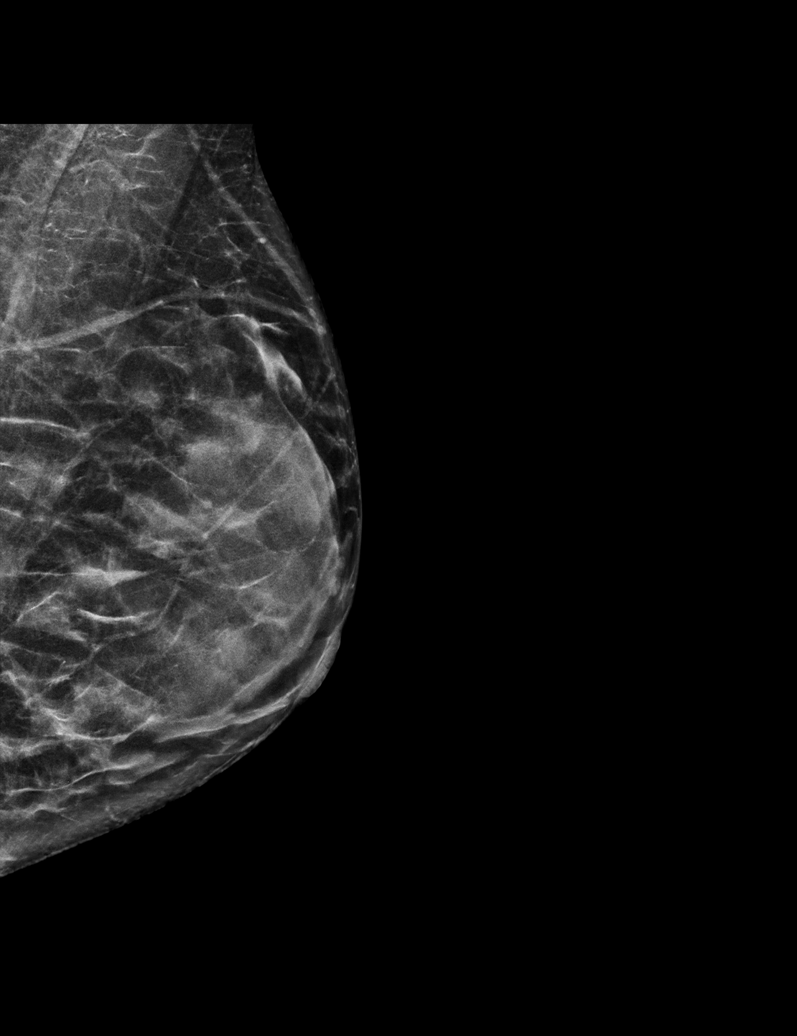

[R MLO synth-2D]
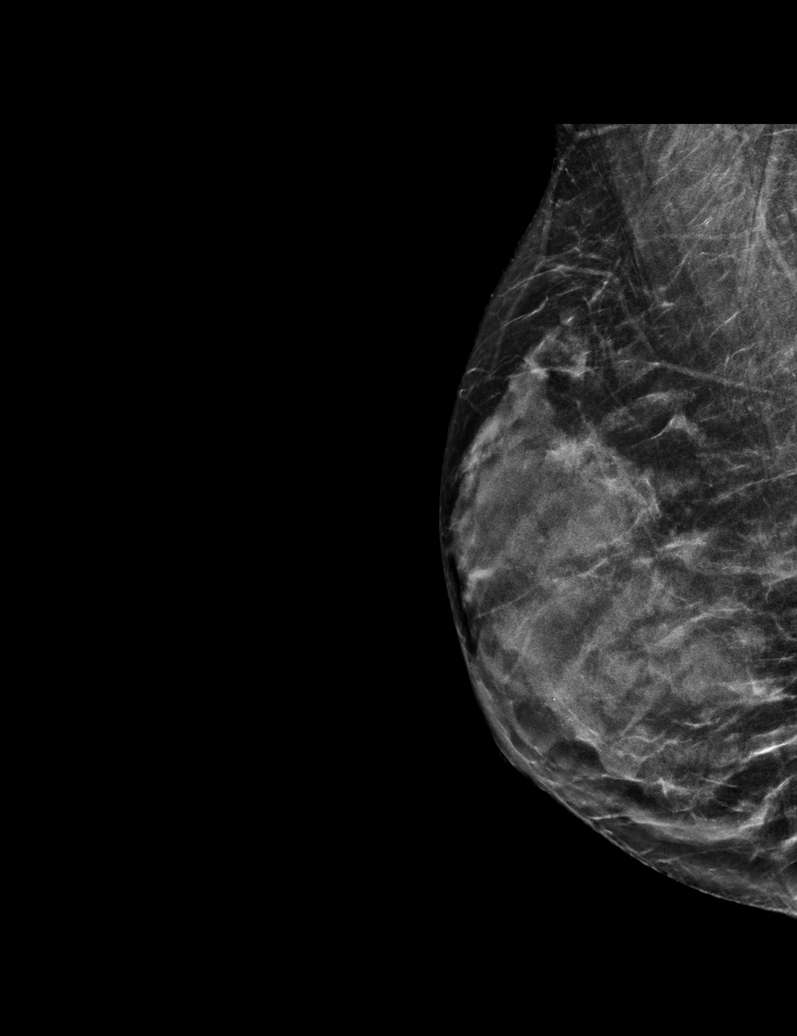

[R MLO tomo · tomo slice 25/50.0]
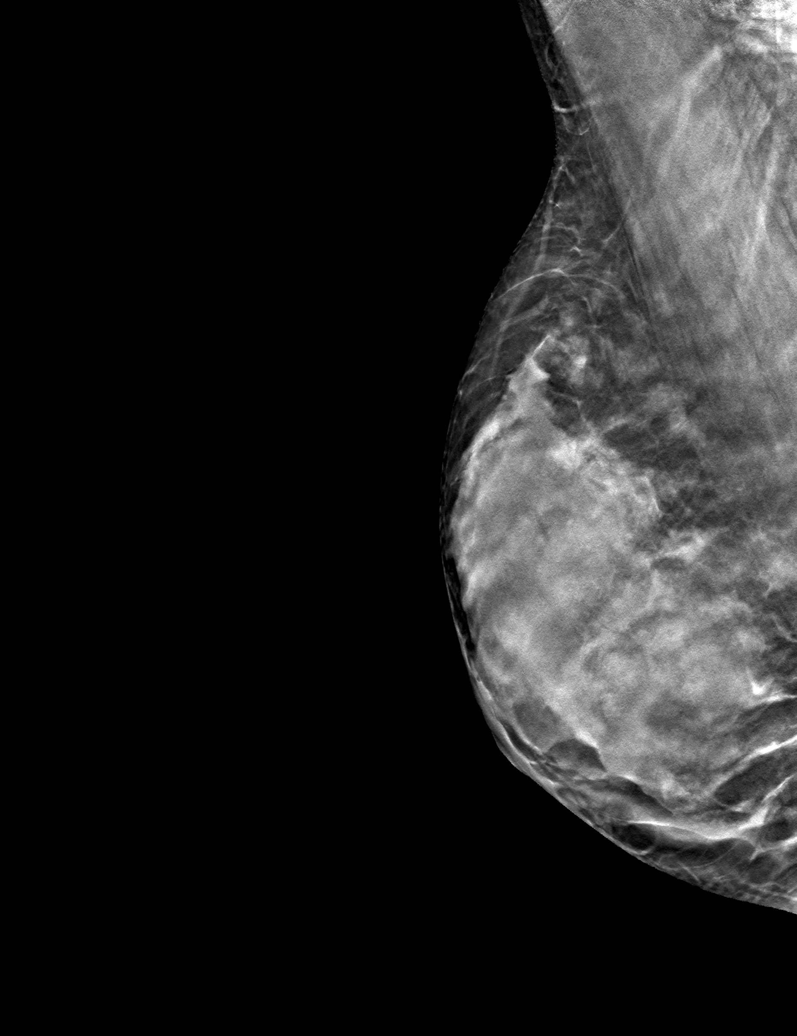

[6 of 30 positions shown; findings below may reference images not displayed]

ACR Breast Density Category d: The breast tissue is extremely dense,
which lowers the sensitivity of mammography.
FINDINGS: In the right breast, a possible mass warrants further evaluation. In
the left breast, no findings suspicious for malignancy. Images were
processed with CAD.
IMPRESSION: Further evaluation is suggested for possible mass in the right
breast.

RECOMMENDATION:
Diagnostic mammogram and possibly ultrasound of the right breast.
(Code:X8-T-11G)

The patient will be contacted regarding the findings, and additional
imaging will be scheduled.

BI-RADS CATEGORY  0: Incomplete. Need additional imaging evaluation
and/or prior mammograms for comparison.

## 2021-08-24 DIAGNOSIS — Z23 Encounter for immunization: Secondary | ICD-10-CM | POA: Diagnosis not present

## 2021-09-18 ENCOUNTER — Ambulatory Visit: Payer: BC Managed Care – PPO | Admitting: Allergy

## 2021-10-07 ENCOUNTER — Ambulatory Visit (INDEPENDENT_AMBULATORY_CARE_PROVIDER_SITE_OTHER): Payer: BC Managed Care – PPO | Admitting: Allergy

## 2021-10-07 ENCOUNTER — Encounter: Payer: Self-pay | Admitting: Allergy

## 2021-10-07 ENCOUNTER — Other Ambulatory Visit: Payer: Self-pay

## 2021-10-07 VITALS — BP 126/88 | HR 86 | Temp 98.3°F | Resp 16 | Ht 65.0 in | Wt 144.6 lb

## 2021-10-07 DIAGNOSIS — D841 Defects in the complement system: Secondary | ICD-10-CM | POA: Diagnosis not present

## 2021-10-07 NOTE — Patient Instructions (Addendum)
Please keep track of your attacks even mild ones. Continue Orladeyo - 1 pill orally daily for preventative measures. Use Firazyr for acute attacks and go to ER after using the medication.  Will let Tammy know that you need a refill.  Make sure you take it your dentist appointments and have someone else drive you there.  If you get a colonoscopy, recommend getting the first one at a hospital setting and NOT at an outpatient surgical center in case you have a large reaction. Make sure you take your Firazyr as well.  Follow up in 6 months or sooner if needed.

## 2021-10-07 NOTE — Progress Notes (Signed)
Follow Up Note  RE: Jasmine Le MRN: [id number] DOB: May 12, 1976 Date of Office Visit: 10/07/2021  Referring provider: Michael Boston, MD Primary care provider: Michael Boston, MD  Chief Complaint: Follow-up (Patient here for a follow up. States everything has been going well, no changes since last visit.)  History of Present Illness: I had the pleasure of seeing Jasmine Le for a follow up visit at the [facility] on 10/07/2021. She is a 45 y.o. female, who is being followed for hereditary angioedema. Her previous allergy office visit was on 04/17/2021 with Dr. Maudie Mercury. Today is a regular follow up visit.  Hereditary angioedema Patient started Orladeyo 1 pill with dinner and tolerating it well.   Patient went to the dentist twice this month. She had one side of her mouth deep cleaned and the following day the other side with no issues. Initially she was very nervous but noticing that she didn't have any issues after the first day, she was less anxious about the second appointment.   Denies having any swelling episodes since started Jasmine Le.  Patient had 2 episodes of trauma to hand and face which usually would lead to a swelling flare but this time she had no swelling. Since starting Orladeyo she has been less anxious about attacks.   Still has Firazyr at home but it may be expired.   No colonoscopy yet.   Assessment and Plan: Jasmine Le is a 45 y.o. female with: Hereditary angioedema ([place]) Past history - Diagnosed with HAE in her 64s in [place] after having frequent bouts of angioedema of her face, feet, hands, abdominal area. One episode of laryngeal swelling requiring ICU stay without intubation. Since her initially diagnosis, severe attacks have diminished and last ER visit was in 2019 requiring Berinert. However, she does have milder angioedema of hands and feet throughout the year. No previous prophylactic medications and does not have acute medicine on hand  either. Previously she had prophylactic IV medications prior to an oral surgery 5-10 years ago and did well. No other family members with HAE.  Interim history - no swelling episodes since on Orladeyo. Had deep cleaning with dentist with no issues.  Please keep track of your attacks even mild ones. Continue Orladeyo - 1 pill orally daily for preventative measures. Use Firazyr for acute attacks and go to ER after using the medication.  Will let Tammy know that you need a refill.  Make sure you take it your dentist appointments and have someone else drive you there.  If you get a colonoscopy, recommend getting the first one at a hospital setting and NOT at an outpatient surgical center in case you have a large reaction. Make sure you take your Firazyr as well.  Return in about 6 months (around 04/06/2022).  No orders of the defined types were placed in this encounter.  Lab Orders  No laboratory test(s) ordered today    Diagnostics: None.   Medication List:  Current Outpatient Medications  Medication Sig Dispense Refill   amLODipine (NORVASC) 5 MG tablet Take 5 mg by mouth daily.      fexofenadine (ALLEGRA) 180 MG tablet Take 180 mg by mouth daily.     icatibant (FIRAZYR) 30 MG/3ML injection Inject into the skin as needed.     levonorgestrel (MIRENA) 20 MCG/24HR IUD 1 each by Intrauterine route once.      Multiple Vitamin (MULTIVITAMIN WITH MINERALS) TABS tablet Take 3 tablets by mouth daily.  Multiple Vitamin (MULTIVITAMIN) capsule Take 1 capsule by mouth daily.     No current facility-administered medications for this visit.   Facility-Administered Medications Ordered in Other Visits  Medication Dose Route Frequency Provider Last Rate Last Admin   0.9 %  sodium chloride infusion   Intravenous Continuous Coralie Keens, MD       C1 esterase inhibitor (Human) (BERINERT) injection 1,000 Units  1,000 Units Intravenous Once Coralie Keens, MD       Allergies: Allergies   Allergen Reactions   Thiethylperazine Anaphylaxis   Ace Inhibitors Swelling   Erythromycin Nausea And Vomiting   Ibuprofen Nausea And Vomiting    In high doses   I reviewed her past medical history, social history, family history, and environmental history and no significant changes have been reported from her previous visit.  Review of Systems  Constitutional:  Negative for appetite change, chills, fever and unexpected weight change.  HENT:  Negative for congestion and rhinorrhea.   Eyes:  Negative for itching.  Respiratory:  Negative for cough, chest tightness, shortness of breath and wheezing.   Cardiovascular:  Negative for chest pain.  Gastrointestinal:  Negative for abdominal pain.  Genitourinary:  Negative for difficulty urinating.  Skin:  Negative for rash.  Allergic/Immunologic: Negative for food allergies.  Neurological:  Negative for headaches.   Objective: BP 126/88   Pulse 86   Temp 98.3 F (36.8 C) (Temporal)   Resp 16   Ht 5\' 5"  (1.651 m)   Wt 144 lb 9.6 oz (65.6 kg)   SpO2 99%   BMI 24.06 kg/m  Body mass index is 24.06 kg/m. Physical Exam Vitals and nursing note reviewed.  Constitutional:      Appearance: She is well-developed.  HENT:     Head: Normocephalic and atraumatic.     Right Ear: Tympanic membrane and external ear normal.     Left Ear: Tympanic membrane and external ear normal.     Nose: Nose normal.  Eyes:     Conjunctiva/sclera: Conjunctivae normal.  Cardiovascular:     Rate and Rhythm: Normal rate and regular rhythm.     Heart sounds: Normal heart sounds. No murmur heard.   No friction rub. No gallop.  Pulmonary:     Effort: Pulmonary effort is normal.     Breath sounds: Normal breath sounds. No wheezing or rales.  Musculoskeletal:     Cervical back: Neck supple.  Skin:    General: Skin is warm.     Findings: No rash.  Neurological:     Mental Status: She is alert and oriented to person, place, and time.  Psychiatric:         Behavior: Behavior normal.   Previous notes and tests were reviewed. The plan was reviewed with the patient/family, and all questions/concerned were addressed.  It was my pleasure to see Jasmine Le today and participate in her care. Please feel free to contact me with any questions or concerns.  Sincerely,  [name], DO Allergy & Immunology  [facility] office: [place] office: [phone]

## 2021-10-07 NOTE — Assessment & Plan Note (Signed)
Past history - Diagnosed with HAE in her 83s in [place] after having frequent bouts of angioedema of her face, feet, hands, abdominal area. One episode of laryngeal swelling requiring ICU stay without intubation. Since her initially diagnosis, severe attacks have diminished and last ER visit was in 2019 requiring Berinert. However, she does have milder angioedema of hands and feet throughout the year. No previous prophylactic medications and does not have acute medicine on hand either. Previously she had prophylactic IV medications prior to an oral surgery 5-10 years ago and did well. No other family members with HAE.  Interim history - no swelling episodes since on Orladeyo. Had deep cleaning with dentist with no issues.   Please keep track of your attacks even mild ones.  Continue Orladeyo - 1 pill orally daily for preventative measures.  Use Firazyr for acute attacks and go to ER after using the medication.   Will let Tammy know that you need a refill.   Make sure you take it your dentist appointments and have someone else drive you there.   If you get a colonoscopy, recommend getting the first one at a hospital setting and NOT at an outpatient surgical center in case you have a large reaction.  Make sure you take your Firazyr as well.

## 2021-10-22 ENCOUNTER — Telehealth: Payer: Self-pay | Admitting: *Deleted

## 2021-10-22 MED ORDER — ICATIBANT ACETATE 30 MG/3ML ~~LOC~~ SOLN: 30.0000 mg | SUBCUTANEOUS | 3 refills | Status: DC | PRN

## 2021-10-22 NOTE — Telephone Encounter (Signed)
-----   Message from [name], DO sent at 10/07/2021  1:00 PM EST ----- Patient needs new Rx for Jasmine Le - does she need new paperwork? Can you please submit and put order in? Thank you.

## 2021-10-22 NOTE — Telephone Encounter (Signed)
Called patient advised approval for Orledayo and Icatibant and refill to [facility] with phone number given to patient to reorder same

## 2021-11-27 ENCOUNTER — Encounter: Payer: Self-pay | Admitting: Internal Medicine

## 2021-12-12 ENCOUNTER — Encounter: Payer: Self-pay | Admitting: Internal Medicine

## 2021-12-12 ENCOUNTER — Ambulatory Visit (INDEPENDENT_AMBULATORY_CARE_PROVIDER_SITE_OTHER): Payer: BC Managed Care – PPO | Admitting: Internal Medicine

## 2021-12-12 VITALS — BP 126/88 | HR 86 | Ht 65.0 in | Wt 144.0 lb

## 2021-12-12 DIAGNOSIS — D841 Defects in the complement system: Secondary | ICD-10-CM | POA: Diagnosis not present

## 2021-12-12 DIAGNOSIS — Z1211 Encounter for screening for malignant neoplasm of colon: Secondary | ICD-10-CM

## 2021-12-12 NOTE — Progress Notes (Signed)
Chief Complaint: Colon cancer screening  HPI : 46 year female with history of hereditary angioedema presents with colon cancer screening  Patient presents to discuss colon cancer screening since she has an underlying diagnosis of her hereditary angioedema.  Her hereditary angioedema causes swelling episodes that are even caused by stress or certain activities. She is now on preventative medication called Orladeyo as prescribed by her allergist to try to prevent angioedema episodes. The only procedure that she has had done previously was a lumpectomy. At that time she had to have a dose of Berinert given before her procedure. Denies blood thinners. Denies blood in stools, weight loss, diarrhea, constipation. Denies fam hx of GI cancers. Denies prior EGD or colonoscopies. Per patient intake sheet, denies dysphagia, N&V, GERD.  She works as a Landscape architect for [employer].    Past Medical History:  Diagnosis Date   Angio-edema    Pneumonia    Urticaria      Past Surgical History:  Procedure Laterality Date   BREAST LUMPECTOMY     BREAST LUMPECTOMY WITH RADIOACTIVE SEED LOCALIZATION Right 02/01/2020   Procedure: RIGHT BREAST LUMPECTOMY WITH RADIOACTIVE SEED LOCALIZATION;  Surgeon: Coralie Keens, MD;  Location: [facility] OR;  Service: General;  Laterality: Right;  LMA   WISDOM TOOTH EXTRACTION     Family History  Problem Relation Age of Onset   Asthma Mother    Allergic rhinitis Neg Hx    Eczema Neg Hx    Urticaria Neg Hx    Cancer - Colon Neg Hx    Stomach cancer Neg Hx    Esophageal cancer Neg Hx    Pancreatic cancer Neg Hx    Social History   Tobacco Use   Smoking status: Every Day    Packs/day: 1.00    Types: Cigarettes   Smokeless tobacco: Never  Vaping Use   Vaping Use: Never used  Substance Use Topics   Alcohol use: Yes    Comment: occasional   Drug use: Never   Current Outpatient Medications  Medication Sig Dispense Refill   amLODipine (NORVASC) 5 MG  tablet Take 5 mg by mouth daily.      fexofenadine (ALLEGRA) 180 MG tablet Take 180 mg by mouth daily.     icatibant (FIRAZYR) 30 MG/3ML injection Inject 3 mLs (30 mg total) into the skin as needed. 3 mL 3   levonorgestrel (MIRENA) 20 MCG/24HR IUD 1 each by Intrauterine route once.      Multiple Vitamin (MULTIVITAMIN) capsule Take 1 capsule by mouth daily.     ORLADEYO 150 MG CAPS Take 1 capsule by mouth daily.     No current facility-administered medications for this visit.   Facility-Administered Medications Ordered in Other Visits  Medication Dose Route Frequency Provider Last Rate Last Admin   0.9 %  sodium chloride infusion   Intravenous Continuous Coralie Keens, MD       C1 esterase inhibitor (Human) (BERINERT) injection 1,000 Units  1,000 Units Intravenous Once Coralie Keens, MD       Allergies  Allergen Reactions   Thiethylperazine Anaphylaxis   Ace Inhibitors Swelling   Erythromycin Nausea And Vomiting   Ibuprofen Nausea And Vomiting    In high doses     Review of Systems: All systems reviewed and negative except where noted in HPI.   Physical Exam: BP 126/88    Pulse 86    Ht 5\' 5"  (1.651 m)    Wt 144 lb (65.3 kg)    SpO2  98%    BMI 23.96 kg/m  Constitutional: Pleasant,well-developed, female in no acute distress. HEENT: Normocephalic and atraumatic. Conjunctivae are normal. No scleral icterus. Cardiovascular: Normal rate, regular rhythm.  Pulmonary/chest: Effort normal and breath sounds normal. No wheezing, rales or rhonchi. Abdominal: Soft, nondistended, nontender. Bowel sounds active throughout. There are no masses palpable. No hepatomegaly. Extremities: No edema Neurological: Alert and oriented to person place and time. Skin: Skin is warm and dry. No rashes noted. Psychiatric: Normal mood and affect. Behavior is normal.  Labs 01/2020: CBC and BMP unremarkable.  ASSESSMENT AND PLAN: Colon cancer screening Hereditary angioedema She presents for colon  cancer screening and has concerns about undergoing a colonoscopy procedure due to her history of a hereditary angioedema.  I went over the risks and benefits of undergoing colonoscopy with the patient.  Patient is agreeable to proceed with the colonoscopy. I agree with recommendations from her allergist that her for screening colonoscopy should be done at the hospital for safety reasons in case airway management becomes an issue.   - Colonoscopy [facility]. Will plan to perform at the hospital for her first screening colonoscopy. Her allergist is aware and patient will plan to take her Netta Corrigan with her to the procedure  Christia Reading, MD

## 2021-12-12 NOTE — Patient Instructions (Signed)
You have been scheduled at [facility] for a colonoscopy, Separate Instructions given  If you are age 46 or older, your body mass index should be between 23-30. Your Body mass index is 23.96 kg/m. If this is out of the aforementioned range listed, please consider follow up with your Primary Care Provider.  If you are age 23 or younger, your body mass index should be between 19-25. Your Body mass index is 23.96 kg/m. If this is out of the aformentioned range listed, please consider follow up with your Primary Care Provider.   ________________________________________________________  The [facility] providers would like to encourage you to use [facility] to communicate with providers for non-urgent requests or questions.  Due to long hold times on the telephone, sending your provider a message by [facility] may be a faster and more efficient way to get a response.  Please allow 48 business hours for a response.  Please remember that this is for non-urgent requests.  _______________________________________________________   Due to recent changes in healthcare laws, you may see the results of your imaging and laboratory studies on MyChart before your provider has had a chance to review them.  We understand that in some cases there may be results that are confusing or concerning to you. Not all laboratory results come back in the same time frame and the provider may be waiting for multiple results in order to interpret others.  Please give [place] 48 hours in order for your provider to thoroughly review all the results before contacting the office for clarification of your results.    I appreciate the  opportunity to care for you  Thank You   Dayna Barker, MD

## 2021-12-24 DIAGNOSIS — E785 Hyperlipidemia, unspecified: Secondary | ICD-10-CM | POA: Diagnosis not present

## 2021-12-30 ENCOUNTER — Encounter: Payer: BC Managed Care – PPO | Admitting: Internal Medicine

## 2021-12-31 DIAGNOSIS — Z1331 Encounter for screening for depression: Secondary | ICD-10-CM | POA: Diagnosis not present

## 2021-12-31 DIAGNOSIS — I10 Essential (primary) hypertension: Secondary | ICD-10-CM | POA: Diagnosis not present

## 2021-12-31 DIAGNOSIS — Z1339 Encounter for screening examination for other mental health and behavioral disorders: Secondary | ICD-10-CM | POA: Diagnosis not present

## 2021-12-31 DIAGNOSIS — Z0000 Encounter for general adult medical examination without abnormal findings: Secondary | ICD-10-CM | POA: Diagnosis not present

## 2022-02-05 ENCOUNTER — Encounter (HOSPITAL_COMMUNITY): Payer: Self-pay | Admitting: Internal Medicine

## 2022-02-13 ENCOUNTER — Other Ambulatory Visit: Payer: Self-pay

## 2022-02-13 ENCOUNTER — Encounter (HOSPITAL_COMMUNITY): Payer: Self-pay | Admitting: Internal Medicine

## 2022-02-13 ENCOUNTER — Ambulatory Visit (HOSPITAL_COMMUNITY): Payer: BC Managed Care – PPO | Admitting: Certified Registered Nurse Anesthetist

## 2022-02-13 ENCOUNTER — Encounter (HOSPITAL_COMMUNITY)
Admission: RE | Disposition: A | Discharge status: Home / Self Care | Payer: Self-pay | Source: Home / Self Care | Attending: Internal Medicine

## 2022-02-13 ENCOUNTER — Ambulatory Visit (HOSPITAL_COMMUNITY)
Admission: RE | Admit: 2022-02-13 | Discharge: 2022-02-13 | Disposition: A | Discharge status: Home / Self Care | Payer: BC Managed Care – PPO | Attending: Internal Medicine | Admitting: Internal Medicine

## 2022-02-13 DIAGNOSIS — K621 Rectal polyp: Secondary | ICD-10-CM | POA: Insufficient documentation

## 2022-02-13 DIAGNOSIS — F17200 Nicotine dependence, unspecified, uncomplicated: Secondary | ICD-10-CM | POA: Diagnosis not present

## 2022-02-13 DIAGNOSIS — Z1211 Encounter for screening for malignant neoplasm of colon: Secondary | ICD-10-CM | POA: Diagnosis not present

## 2022-02-13 DIAGNOSIS — D120 Benign neoplasm of cecum: Secondary | ICD-10-CM | POA: Insufficient documentation

## 2022-02-13 DIAGNOSIS — K6282 Dysplasia of anus: Secondary | ICD-10-CM | POA: Diagnosis not present

## 2022-02-13 DIAGNOSIS — D122 Benign neoplasm of ascending colon: Secondary | ICD-10-CM | POA: Insufficient documentation

## 2022-02-13 DIAGNOSIS — K635 Polyp of colon: Secondary | ICD-10-CM | POA: Diagnosis not present

## 2022-02-13 DIAGNOSIS — K648 Other hemorrhoids: Secondary | ICD-10-CM | POA: Diagnosis not present

## 2022-02-13 DIAGNOSIS — I10 Essential (primary) hypertension: Secondary | ICD-10-CM | POA: Diagnosis not present

## 2022-02-13 DIAGNOSIS — D125 Benign neoplasm of sigmoid colon: Secondary | ICD-10-CM | POA: Diagnosis not present

## 2022-02-13 DIAGNOSIS — D841 Defects in the complement system: Secondary | ICD-10-CM

## 2022-02-13 HISTORY — PX: POLYPECTOMY: SHX5525

## 2022-02-13 HISTORY — PX: COLONOSCOPY WITH PROPOFOL: SHX5780

## 2022-02-13 SURGERY — COLONOSCOPY WITH PROPOFOL
Anesthesia: Monitor Anesthesia Care

## 2022-02-13 MED ORDER — PROPOFOL 500 MG/50ML IV EMUL
INTRAVENOUS | Status: DC | PRN
  Administered 2022-02-13: 150 ug/kg/min via INTRAVENOUS

## 2022-02-13 MED ORDER — LACTATED RINGERS IV SOLN: INTRAVENOUS | Status: DC | PRN

## 2022-02-13 MED ORDER — AMISULPRIDE (ANTIEMETIC) 5 MG/2ML IV SOLN
10.0000 mg | Freq: Once | INTRAVENOUS | Status: DC | PRN
  Filled 2022-02-13: qty 4

## 2022-02-13 MED ORDER — PROPOFOL 10 MG/ML IV BOLUS
INTRAVENOUS | Status: DC | PRN
  Administered 2022-02-13: 30 mg via INTRAVENOUS

## 2022-02-13 MED ORDER — PROPOFOL 1000 MG/100ML IV EMUL
INTRAVENOUS | Status: AC
  Filled 2022-02-13: qty 100

## 2022-02-13 MED ORDER — ONDANSETRON HCL 4 MG/2ML IJ SOLN: 4.0000 mg | Freq: Once | INTRAMUSCULAR | Status: DC | PRN

## 2022-02-13 MED ORDER — LACTATED RINGERS IV SOLN: INTRAVENOUS | Status: DC

## 2022-02-13 MED ORDER — SODIUM CHLORIDE 0.9 % IV SOLN: INTRAVENOUS | Status: DC

## 2022-02-13 SURGICAL SUPPLY — 22 items

## 2022-02-13 NOTE — Discharge Instructions (Signed)
YOU HAD AN ENDOSCOPIC PROCEDURE TODAY: Refer to the procedure report and other information in the discharge instructions given to you for any specific questions about what was found during the examination. If this information does not answer your questions, please call [place] office at [phone] to clarify.  ° °YOU SHOULD EXPECT: Some feelings of bloating in the abdomen. Passage of more gas than usual. Walking can help get rid of the air that was put into your GI tract during the procedure and reduce the bloating. If you had a lower endoscopy (such as a colonoscopy or flexible sigmoidoscopy) you may notice spotting of blood in your stool or on the toilet paper. Some abdominal soreness may be present for a day or two, also. ° °DIET: Your first meal following the procedure should be a light meal and then it is ok to progress to your normal diet. A half-sandwich or bowl of soup is an example of a good first meal. Heavy or fried foods are harder to digest and may make you feel nauseous or bloated. Drink plenty of fluids but you should avoid alcoholic beverages for 24 hours. If you had a esophageal dilation, please see attached instructions for diet.   ° °ACTIVITY: Your care partner should take you home directly after the procedure. You should plan to take it easy, moving slowly for the rest of the day. You can resume normal activity the day after the procedure however YOU SHOULD NOT DRIVE, use power tools, machinery or perform tasks that involve climbing or major physical exertion for 24 hours (because of the sedation medicines used during the test).  ° °SYMPTOMS TO REPORT IMMEDIATELY: °A gastroenterologist can be reached at any hour. Please call [phone]  for any of the following symptoms:  °Following lower endoscopy (colonoscopy, flexible sigmoidoscopy) °Excessive amounts of blood in the stool  °Significant tenderness, worsening of abdominal pains  °Swelling of the abdomen that is new, acute  °Fever of 100° or  higher  °Following upper endoscopy (EGD, EUS, ERCP, esophageal dilation) °Vomiting of blood or coffee ground material  °New, significant abdominal pain  °New, significant chest pain or pain under the shoulder blades  °Painful or persistently difficult swallowing  °New shortness of breath  °Black, tarry-looking or red, bloody stools ° °FOLLOW UP:  °If any biopsies were taken you will be contacted by phone or by letter within the next 1-3 weeks. Call [phone]  if you have not heard about the biopsies in 3 weeks.  °Please also call with any specific questions about appointments or follow up tests. ° °

## 2022-02-13 NOTE — Anesthesia Postprocedure Evaluation (Signed)
Anesthesia Post Note ? ?Patient: Jasmine Le ? ?Procedure(s) Performed: COLONOSCOPY WITH PROPOFOL ?POLYPECTOMY ? ?  ? ?Patient location during evaluation: PACU ?Anesthesia Type: MAC ?Level of consciousness: awake and alert ?Pain management: pain level controlled ?Vital Signs Assessment: post-procedure vital signs reviewed and stable ?Respiratory status: spontaneous breathing, nonlabored ventilation, respiratory function stable and patient connected to nasal cannula oxygen ?Cardiovascular status: stable and blood pressure returned to baseline ?Postop Assessment: no apparent nausea or vomiting ?Anesthetic complications: no ? ? ?No notable events documented. ? ?Last Vitals:  ?Vitals:  ? 02/13/22 0944 02/13/22 0954  ?BP: 114/70 115/76  ?Pulse: 64 78  ?Resp: 17 11  ?Temp:    ?SpO2: 100% 100%  ?  ?Last Pain:  ?Vitals:  ? 02/13/22 0954  ?TempSrc:   ?PainSc: 0-No pain  ? ? ?  ?  ?  ?  ?  ?  ? ?[name] ? ? ? ? ?

## 2022-02-13 NOTE — Transfer of Care (Signed)
Immediate Anesthesia Transfer of Care Note ? ?Patient: Jasmine Le ? ?Procedure(s) Performed: COLONOSCOPY WITH PROPOFOL ?POLYPECTOMY ? ?Patient Location: Endoscopy Unit ? ?Anesthesia Type:MAC ? ?Level of Consciousness: awake and patient cooperative ? ?Airway & Oxygen Therapy: Patient Spontanous Breathing and Patient connected to face mask ? ?Post-op Assessment: Report given to RN and Post -op Vital signs reviewed and stable ? ?Post vital signs: Reviewed and stable ? ?Last Vitals:  ?Vitals Value Taken Time  ?BP    ?Temp    ?Pulse    ?Resp 13 02/13/22 0934  ?SpO2    ?Vitals shown include unvalidated device data. ? ?Last Pain:  ?Vitals:  ? 02/13/22 0837  ?TempSrc: Temporal  ?PainSc: 0-No pain  ?   ? ?  ? ?Complications: No notable events documented. ?

## 2022-02-13 NOTE — Anesthesia Preprocedure Evaluation (Addendum)
Anesthesia Evaluation  ?Patient identified by MRN, date of birth, ID band ?Patient awake ? ? ? ?Reviewed: ?Allergy & Precautions, NPO status , Patient's Chart, lab work & pertinent test results ? ?Airway ?Mallampati: II ? ?TM Distance: >3 FB ?Neck ROM: Full ? ? ? Dental ? ?(+) Teeth Intact, Dental Advisory Given ?  ?Pulmonary ?Current Smoker,  ?  ?breath sounds clear to auscultation ? ? ? ? ? ? Cardiovascular ?hypertension, Pt. on medications ? ?Rhythm:Regular Rate:Normal ? ? ?  ?Neuro/Psych ?negative neurological ROS ? negative psych ROS  ? GI/Hepatic ?negative GI ROS, Neg liver ROS,   ?Endo/Other  ?negative endocrine ROS ? Renal/GU ?negative Renal ROS  ? ?  ?Musculoskeletal ?negative musculoskeletal ROS ?(+)  ? Abdominal ?Normal abdominal exam  (+)   ?Peds ? Hematology ?negative hematology ROS ?(+)   ?Anesthesia Other Findings ? ? Reproductive/Obstetrics ? ?  ? ? ? ? ? ? ? ? ? ? ? ? ? ?  ?  ? ? ? ? ? ? ? ?Anesthesia Physical ?Anesthesia Plan ? ?ASA: 2 ? ?Anesthesia Plan: MAC  ? ?Post-op Pain Management:   ? ?Induction: Intravenous ? ?PONV Risk Score and Plan: 0 and Propofol infusion ? ?Airway Management Planned: Natural Airway and Simple Face Mask ? ?Additional Equipment: None ? ?Intra-op Plan:  ? ?Post-operative Plan:  ? ?Informed Consent: I have reviewed the patients History and Physical, chart, labs and discussed the procedure including the risks, benefits and alternatives for the proposed anesthesia with the patient or authorized representative who has indicated his/her understanding and acceptance.  ? ? ? ? ? ?Plan Discussed with: CRNA ? ?Anesthesia Plan Comments:   ? ? ? ? ? ?Anesthesia Quick Evaluation ? ?

## 2022-02-13 NOTE — H&P (Signed)
? ?GASTROENTEROLOGY PROCEDURE H&P NOTE  ? ?Primary Care Physician: ?Michael Boston, MD ? ? ? ?Reason for Procedure:   Colon cancer screening ? ?Plan:    Colonoscopy ? ?Patient is appropriate for endoscopic procedure(s) in the hospital setting. ? ?The nature of the procedure, as well as the risks, benefits, and alternatives were carefully and thoroughly reviewed with the patient. Ample time for discussion and questions allowed. The patient understood, was satisfied, and agreed to proceed.  ? ? ? ?HPI: ?Jasmine Le is a 46 y.o. female who presents for colonoscopy for colon cancer screening. She was last seen in GI clinic on 12/12/21. No major changes from that clinic visit. ? ?Past Medical History:  ?Diagnosis Date  ? Angio-edema   ? Pneumonia   ? Urticaria   ? ? ?Past Surgical History:  ?Procedure Laterality Date  ? BREAST LUMPECTOMY    ? BREAST LUMPECTOMY WITH RADIOACTIVE SEED LOCALIZATION Right 02/01/2020  ? Procedure: RIGHT BREAST LUMPECTOMY WITH RADIOACTIVE SEED LOCALIZATION;  Surgeon: Coralie Keens, MD;  Location: [place];  Service: General;  Laterality: Right;  LMA  ? WISDOM TOOTH EXTRACTION    ? ? ?Prior to Admission medications   ?Medication Sig Start Date End Date Taking? Authorizing Provider  ?amLODipine (NORVASC) 5 MG tablet Take 5 mg by mouth daily.  09/08/20  Yes [provider]  ?fexofenadine (ALLEGRA) 180 MG tablet Take 180 mg by mouth daily as needed for allergies.   Yes [provider]  ?icatibant (FIRAZYR) 30 MG/3ML injection Inject 3 mLs (30 mg total) into the skin as needed. ?Patient taking differently: Inject 30 mg into the skin as needed (acute attack HAE). 10/22/21  Yes [name], DO  ?levonorgestrel (MIRENA) 20 MCG/24HR IUD 1 each by Intrauterine route once.    Yes [provider]  ?Multiple Vitamins-Minerals (EMERGEN-C IMMUNE PO) Take 3 tablets by mouth daily with lunch. Gummies   Yes [provider]  ?ORLADEYO 150 MG CAPS Take 150 capsules by mouth  daily with supper. 12/06/21  Yes [provider]  ? ? ?No current facility-administered medications for this encounter.  ? ?Facility-Administered Medications Ordered in Other Encounters  ?Medication Dose Route Frequency Provider Last Rate Last Admin  ? 0.9 %  sodium chloride infusion   Intravenous Continuous Coralie Keens, MD      ? C1 esterase inhibitor (Human) (BERINERT) injection 1,000 Units  1,000 Units Intravenous Once Coralie Keens, MD      ? ? ?Allergies as of 12/12/2021 - Review Complete 12/12/2021  ?Allergen Reaction Noted  ? Thiethylperazine Anaphylaxis 01/11/2018  ? Ace inhibitors Swelling 01/11/2018  ? Erythromycin Nausea And Vomiting 01/11/2018  ? Ibuprofen Nausea And Vomiting 01/11/2018  ? ? ?Family History  ?Problem Relation Age of Onset  ? Asthma Mother   ? Allergic rhinitis Neg Hx   ? Eczema Neg Hx   ? Urticaria Neg Hx   ? Cancer - Colon Neg Hx   ? Stomach cancer Neg Hx   ? Esophageal cancer Neg Hx   ? Pancreatic cancer Neg Hx   ? ? ?Social History  ? ?Socioeconomic History  ? Marital status: Married  ?  Spouse name: Not on file  ? Number of children: Not on file  ? Years of education: Not on file  ? Highest education level: Not on file  ?Occupational History  ? Not on file  ?Tobacco Use  ? Smoking status: Every Day  ?  Packs/day: 1.00  ?  Types: Cigarettes  ?  Smokeless tobacco: Never  ?Vaping Use  ? Vaping Use: Never used  ?Substance and Sexual Activity  ? Alcohol use: Yes  ?  Comment: occasional  ? Drug use: Never  ? Sexual activity: Yes  ?  Birth control/protection: I.U.D.  ?Other Topics Concern  ? Not on file  ?Social History Narrative  ? Not on file  ? ?Social Determinants of Health  ? ?Financial Resource Strain: Not on file  ?Food Insecurity: Not on file  ?Transportation Needs: Not on file  ?Physical Activity: Not on file  ?Stress: Not on file  ?Social Connections: Not on file  ?Intimate Partner Violence: Not on file  ? ? ?Physical Exam: ?Vital signs in last 24 hours: ?There  were no vitals taken for this visit. ?GEN: NAD ?EYE: Sclerae anicteric ?ENT: MMM ?CV: Non-tachycardic ?Pulm: No increased work of breathing ?GI: Soft, NT/ND ?NEURO:  Alert & Oriented ? ? ?Christia Reading, MD ?[facility] ? ?02/13/2022 8:24 AM ? ?

## 2022-02-13 NOTE — Op Note (Signed)
[facility] ?Patient Name: Jasmine Le ?Procedure Date: 02/13/2022 ?MRN: [id number] ?Attending MD: Georgian Co ,  ?Date of Birth: 04-Sep-1976 ?CSN: [id number] ?Age: 46 ?Admit Type: Outpatient ?Procedure:                Colonoscopy ?Indications:              Screening for colorectal malignant neoplasm ?Providers:                Adline Mango" Nell Range, Technician,  ?                          Dulcy Fanny ?Referring MD:              ?Medicines:                Monitored Anesthesia Care ?Complications:            No immediate complications. ?Estimated Blood Loss:     Estimated blood loss was minimal. ?Procedure:                Pre-Anesthesia Assessment: ?                          - Prior to the procedure, a History and Physical  ?                          was performed, and patient medications and  ?                          allergies were reviewed. The patient's tolerance of  ?                          previous anesthesia was also reviewed. The risks  ?                          and benefits of the procedure and the sedation  ?                          options and risks were discussed with the patient.  ?                          All questions were answered, and informed consent  ?                          was obtained. Prior Anticoagulants: The patient has  ?                          taken no previous anticoagulant or antiplatelet  ?                          agents. ASA Grade Assessment: III - A patient with  ?                          severe systemic disease. After reviewing the risks  ?  and benefits, the patient was deemed in  ?                          satisfactory condition to undergo the procedure. ?                          After obtaining informed consent, the colonoscope  ?                          was passed under direct vision. Throughout the  ?                          procedure, the patient's blood pressure, pulse, and  ?                           oxygen saturations were monitored continuously. The  ?                          CF-HQ190L ([id number]) Olympus colonoscope was  ?                          introduced through the anus and advanced to the the  ?                          terminal ileum, with identification of the  ?                          appendiceal orifice and IC valve. The colonoscopy  ?                          was performed without difficulty. The patient  ?                          tolerated the procedure well. The quality of the  ?                          bowel preparation was good. The terminal ileum,  ?                          ileocecal valve, appendiceal orifice, and rectum  ?                          were photographed. ?Scope In: 8:55:34 AM ?Scope Out: 9:30:16 AM ?Scope Withdrawal Time: 0 hours 27 minutes 21 seconds  ?Total Procedure Duration: 0 hours 34 minutes 41 seconds  ?Findings: ?     Two sessile polyps were found in the ascending colon and cecum. The  ?     polyps were 2 to 5 mm in size. These polyps were removed with a cold  ?     snare. Resection and retrieval were complete. ?     Four sessile polyps were found in the sigmoid colon. The polyps were 4  ?     to 6 mm in size. These polyps were removed with a cold snare. Resection  ?     and retrieval were complete. ?  A 5 mm polyp was found in the rectum. The polyp was sessile. The polyp  ?     was removed with a cold snare. Resection and retrieval were complete. ?     Non-bleeding internal hemorrhoids were found during retroflexion. ?Impression:               - Two 2 to 5 mm polyps in the ascending colon and  ?                          in the cecum, removed with a cold snare. Resected  ?                          and retrieved. ?                          - Four 4 to 6 mm polyps in the sigmoid colon,  ?                          removed with a cold snare. Resected and retrieved. ?                          - One 5 mm polyp in the rectum, removed with a cold  ?                           snare. Resected and retrieved. ?                          - Non-bleeding internal hemorrhoids. ?Moderate Sedation: ?     Not Applicable - Patient had care per Anesthesia. ?Recommendation:           - Discharge patient to home (with escort). ?                          - Await pathology results. ?                          - The findings and recommendations were discussed  ?                          with the patient. ?Procedure Code(s):        --- Professional --- ?                          [phone], Colonoscopy, flexible; with removal of  ?                          tumor(s), polyp(s), or other lesion(s) by snare  ?                          technique ?Diagnosis Code(s):        --- Professional --- ?                          Z12.11, Encounter for screening for malignant  ?  neoplasm of colon ?                          K63.5, Polyp of colon ?                          K62.1, Rectal polyp ?                          K64.8, Other hemorrhoids ?CPT copyright 2019 American Medical Association. All rights reserved. ?The codes documented in this report are preliminary and upon coder review may  ?be revised to meet current compliance requirements. ?Georgian Co,  ?02/13/2022 9:34:16 AM ?Number of Addenda: 0 ?

## 2022-02-13 NOTE — Anesthesia Procedure Notes (Signed)
Procedure Name: [place] ?Date/Time: 02/13/2022 8:54 AM ?Performed by: Claudia Desanctis, CRNA ?Pre-anesthesia Checklist: Patient identified, Emergency Drugs available, Suction available and Patient being monitored ?Patient Re-evaluated:Patient Re-evaluated prior to induction ?Oxygen Delivery Method: Simple face mask ? ? ? ? ?

## 2022-02-14 ENCOUNTER — Encounter (HOSPITAL_COMMUNITY): Payer: Self-pay | Admitting: Internal Medicine

## 2022-02-14 LAB — SURGICAL PATHOLOGY

## 2022-02-16 ENCOUNTER — Encounter: Payer: Self-pay | Admitting: Internal Medicine

## 2022-02-16 NOTE — Progress Notes (Signed)
Hi Ammie, let's send a referral to general surgery for high grade anal intraepithelial lesion. Thanks.

## 2022-03-24 DIAGNOSIS — D013 Carcinoma in situ of anus and anal canal: Secondary | ICD-10-CM | POA: Diagnosis not present

## 2022-03-25 ENCOUNTER — Other Ambulatory Visit: Payer: Self-pay | Admitting: Obstetrics and Gynecology

## 2022-03-25 ENCOUNTER — Other Ambulatory Visit: Payer: Self-pay | Admitting: Internal Medicine

## 2022-03-25 DIAGNOSIS — Z9889 Other specified postprocedural states: Secondary | ICD-10-CM

## 2022-04-07 ENCOUNTER — Ambulatory Visit: Payer: BC Managed Care – PPO | Admitting: Allergy

## 2022-04-08 ENCOUNTER — Ambulatory Visit
Admission: RE | Admit: 2022-04-08 | Discharge: 2022-04-08 | Disposition: A | Discharge status: Home / Self Care | Payer: BC Managed Care – PPO | Source: Ambulatory Visit | Attending: Obstetrics and Gynecology | Admitting: Obstetrics and Gynecology

## 2022-04-08 DIAGNOSIS — Z1231 Encounter for screening mammogram for malignant neoplasm of breast: Secondary | ICD-10-CM | POA: Diagnosis not present

## 2022-04-08 DIAGNOSIS — Z9889 Other specified postprocedural states: Secondary | ICD-10-CM

## 2022-04-08 NOTE — Progress Notes (Signed)
Follow Up Note  RE: Jasmine Le MRN: [id number] DOB: 1976-10-03 Date of Office Visit: 04/09/2022  Referring provider: Melida Quitter, MD Primary care provider: Melida Quitter, MD  Chief Complaint: Follow-up  History of Present Illness: I had the pleasure of seeing Jasmine Le for a follow up visit at the [facility] on 04/09/2022. She is a 46 y.o. female, who is being followed for HAE. Her previous allergy office visit was on 10/07/2021 with Dr. Selena Batten. Today is a regular follow up visit.  Hereditary angioedema Patient had colonoscopy in March with no issues. She felt slight abdominal swelling afterwards but did not need to use Firazyr as she had no other symptoms and wasn't sure if it's was her HAE or just the fact that she had colonoscopy.  Colonoscopy was done in the hospital setting with no issues.   She had some polyps via colonoscopy and is scheduled for anorectal surgery in June to make sure there are no additional issues.   Taking Orladeyo daily with good benefit.  She may noticed some increased gas and had 1 episode of upset stomach.   No issues with dental cleaning and fillings. She feels much more relieved now knowing that she can have these procedures and not have an attack.   Had mammogram yesterday with no issues. She is due for pap smear soon as well.  Assessment and Plan: Jasmine Le is a 46 y.o. female with: Hereditary angioedema (HCC) Past history - Diagnosed with HAE in her 52s in [place] after having frequent bouts of angioedema of her face, feet, hands, abdominal area. One episode of laryngeal swelling requiring ICU stay without intubation. Since her initially diagnosis, severe attacks have diminished and last ER visit was in 2019 requiring Berinert. However, she does have milder angioedema of hands and feet throughout the year. No previous prophylactic medications and does not have acute medicine on hand either. Previously she had prophylactic IV  medications prior to an oral surgery 5-10 years ago and did well. No other family members with HAE.  Interim history - no swelling episodes since on Orladeyo. Had colonoscopy, dental cleaning, filling and mammogram with no attacks.  Please keep track of your attacks even mild ones. Continue Orladeyo - 1 pill orally daily for preventative measures. If it doesn't work as well there are many other options these days. Use Firazyr for acute attacks and go to ER after using the medication.  Make sure you take it to any procedural appointments you have - dental, mammogram, pap smears, colonoscopy. Recommend to continue to get colonoscopy in a hospital setting and NOT at an outpatient surgical center in case you have a large reaction.  Return in about 6 months (around 10/10/2022).  No orders of the defined types were placed in this encounter.  Lab Orders  No laboratory test(s) ordered today   Diagnostics: None.    Medication List:  Current Outpatient Medications  Medication Sig Dispense Refill   amLODipine (NORVASC) 5 MG tablet Take 5 mg by mouth daily.      fexofenadine (ALLEGRA) 180 MG tablet Take 180 mg by mouth daily as needed for allergies.     levonorgestrel (MIRENA) 20 MCG/24HR IUD 1 each by Intrauterine route once.      Multiple Vitamins-Minerals (EMERGEN-C IMMUNE PO) Take 3 tablets by mouth daily with lunch. Gummies     ORLADEYO 150 MG CAPS Take 150 capsules by mouth daily with supper.     icatibant (FIRAZYR) 30  MG/3ML injection Inject 3 mLs (30 mg total) into the skin as needed. (Patient not taking: Reported on 04/09/2022) 3 mL 3   No current facility-administered medications for this visit.   Allergies: Allergies  Allergen Reactions   Thiethylperazine Anaphylaxis   Ace Inhibitors Swelling   Erythromycin Nausea And Vomiting   Ibuprofen Nausea And Vomiting    In high doses   I reviewed her past medical history, social history, family history, and environmental history and no  significant changes have been reported from her previous visit.  Review of Systems  Constitutional:  Negative for appetite change, chills, fever and unexpected weight change.  HENT:  Negative for congestion and rhinorrhea.   Eyes:  Negative for itching.  Respiratory:  Negative for cough, chest tightness, shortness of breath and wheezing.   Cardiovascular:  Negative for chest pain.  Gastrointestinal:  Negative for abdominal pain.  Genitourinary:  Negative for difficulty urinating.  Skin:  Negative for rash.  Allergic/Immunologic: Negative for food allergies.  Neurological:  Negative for headaches.   Objective: BP 122/76   Pulse 73   Temp 97.8 F (36.6 C)   Resp 16   Ht 5\' 5"  (1.651 m)   Wt 144 lb 6 oz (65.5 kg)   SpO2 98%   BMI 24.03 kg/m  Body mass index is 24.03 kg/m. Physical Exam Vitals and nursing note reviewed.  Constitutional:      Appearance: She is well-developed.  HENT:     Head: Normocephalic and atraumatic.     Right Ear: Tympanic membrane and external ear normal.     Left Ear: Tympanic membrane and external ear normal.     Nose: Nose normal.  Eyes:     Conjunctiva/sclera: Conjunctivae normal.  Cardiovascular:     Rate and Rhythm: Normal rate and regular rhythm.     Heart sounds: Normal heart sounds. No murmur heard.   No friction rub. No gallop.  Pulmonary:     Effort: Pulmonary effort is normal.     Breath sounds: Normal breath sounds. No wheezing or rales.  Musculoskeletal:     Cervical back: Neck supple.  Skin:    General: Skin is warm.     Findings: No rash.  Neurological:     Mental Status: She is alert and oriented to person, place, and time.  Psychiatric:        Behavior: Behavior normal.   Previous notes and tests were reviewed. The plan was reviewed with the patient/family, and all questions/concerned were addressed.  It was my pleasure to see [name] today and participate in her care. Please feel free to contact me with any questions or  concerns.  Sincerely,  Wyline Mood, DO Allergy & Immunology  [facility] office: [phone] [facility] office: [phone]

## 2022-04-09 ENCOUNTER — Ambulatory Visit (INDEPENDENT_AMBULATORY_CARE_PROVIDER_SITE_OTHER): Payer: BC Managed Care – PPO | Admitting: Allergy

## 2022-04-09 ENCOUNTER — Encounter: Payer: Self-pay | Admitting: Allergy

## 2022-04-09 VITALS — BP 122/76 | HR 73 | Temp 97.8°F | Resp 16 | Ht 65.0 in | Wt 144.4 lb

## 2022-04-09 DIAGNOSIS — D841 Defects in the complement system: Secondary | ICD-10-CM | POA: Diagnosis not present

## 2022-04-09 NOTE — Assessment & Plan Note (Signed)
Past history - Diagnosed with HAE in her 56s in [place] after having frequent bouts of angioedema of her face, feet, hands, abdominal area. One episode of laryngeal swelling requiring ICU stay without intubation. Since her initially diagnosis, severe attacks have diminished and last ER visit was in 2019 requiring Berinert. However, she does have milder angioedema of hands and feet throughout the year. No previous prophylactic medications and does not have acute medicine on hand either. Previously she had prophylactic IV medications prior to an oral surgery 5-10 years ago and did well. No other family members with HAE.  Interim history - no swelling episodes since on Orladeyo. Had colonoscopy, dental cleaning, filling and mammogram with no attacks.   Please keep track of your attacks even mild ones.  Continue Orladeyo - 1 pill orally daily for preventative measures.  If it doesn't work as well there are many other options these days.  Use Firazyr for acute attacks and go to ER after using the medication.   Make sure you take it to any procedural appointments you have - dental, mammogram, pap smears, colonoscopy.  Recommend to continue to get colonoscopy in a hospital setting and NOT at an outpatient surgical center in case you have a large reaction.

## 2022-04-09 NOTE — Patient Instructions (Addendum)
Please keep track of your attacks even mild ones. Continue Orladeyo - 1 pill orally daily for preventative measures. If it doesn't work as well there are many other options these days.  Use Firazyr for acute attacks and go to ER after using the medication.  Make sure you take it to any procedural appointments you have - dental, mammogram, pap smears, colonoscopy. Recommend to continue to get colonoscopy in a hospital setting and NOT at an outpatient surgical center in case you have a large reaction.  Follow up in 6 months or sooner if needed.

## 2022-04-16 DIAGNOSIS — Z1151 Encounter for screening for human papillomavirus (HPV): Secondary | ICD-10-CM | POA: Diagnosis not present

## 2022-04-16 DIAGNOSIS — Z124 Encounter for screening for malignant neoplasm of cervix: Secondary | ICD-10-CM | POA: Diagnosis not present

## 2022-04-16 DIAGNOSIS — Z01419 Encounter for gynecological examination (general) (routine) without abnormal findings: Secondary | ICD-10-CM | POA: Diagnosis not present

## 2022-04-28 NOTE — Progress Notes (Signed)
Spoke with dr Valma Cava, mda and patient has hereditary angioedema and shoulder be done at [facility] or, called tonya gunter  at dr [facility] office and made aware patient is to be done at [facility] or due to hereditary angioedema.

## 2022-04-29 ENCOUNTER — Encounter (HOSPITAL_COMMUNITY): Payer: Self-pay

## 2022-04-29 NOTE — Progress Notes (Signed)
Please place orders in epic for preop 

## 2022-04-29 NOTE — Progress Notes (Addendum)
PCP - Dorinda Hill , MD LOV 12-31-21 [facility] Cardiologist - no  PPM/ICD -  Device Orders -  Rep Notified -   Chest x-ray -  EKG -  Stress Test -  ECHO -  Cardiac Cath -   Sleep Study -  CPAP -   Fasting Blood Sugar -  Checks Blood Sugar _____ times a day  Blood Thinner Instructions: Aspirin Instructions:  ERAS Protcol - PRE-SURGERY Ensure or G2-    COVID vaccine -x3 pfizer  Activity--Able to walk a flight of stairs without SOB  Anesthesia- HTN, hereditary angioedema   Patient denies shortness of breath, fever, cough and chest pain at PAT appointment   All instructions explained to the patient, with a verbal understanding of the material. Patient agrees to go over the instructions while at home for a better understanding. Patient also instructed to self quarantine after being tested for COVID-19. The opportunity to ask questions was provided.

## 2022-04-29 NOTE — Patient Instructions (Signed)
DUE TO COVID-19 ONLY TWO VISITORS  (aged 46 and older)  ARE ALLOWED TO COME WITH YOU AND STAY IN THE WAITING ROOM ONLY DURING PRE OP AND PROCEDURE.   **NO VISITORS ARE ALLOWED IN THE SHORT STAY AREA OR RECOVERY ROOM!!**  IF YOU WILL BE ADMITTED INTO THE HOSPITAL YOU ARE ALLOWED ONLY FOUR SUPPORT PEOPLE DURING VISITATION HOURS ONLY (7 AM -8PM)   The support person(s) must pass our screening, gel in and out, and wear a mask at all times, including in the patient's room. Patients must also wear a mask when staff or their support person are in the room. Visitors GUEST BADGE MUST BE WORN VISIBLY  One adult visitor may remain with you overnight and MUST be in the room by 8 P.M.     Your procedure is scheduled on: 05-02-22    Report to [facility] Main Entrance    Report to admitting at       [place] AM   Call this number if you have problems the morning of surgery [phone]   Do not eat food :After Midnight.   After Midnight you may have the following liquids until _0630 _____ AM DAY OF SURGERY   then NOTHING BY MOUTH  Water Black Coffee (sugar ok, NO MILK/CREAM OR CREAMERS)  Tea (sugar ok, NO MILK/CREAM OR CREAMERS) regular and decaf                             Plain Jell-O (NO RED)                                           Fruit ices (not with fruit pulp, NO RED)                                     Popsicles (NO RED)                                                                  Juice: apple, WHITE grape, WHITE cranberry Sports drinks like Gatorade (NO RED) Clear broth(vegetable,chicken,beef)                     The day of surgery:  Drink ONE (1) Pre-Surgery Clear Ensure or G2 at AM the morning of surgery. Drink in one sitting. Do not sip.  This drink was given to you during your hospital  pre-op appointment visit. Nothing else to drink after completing the  Pre-Surgery Clear Ensure or G2.          If you have questions, please contact your surgeon's  office.   FOLLOW BOWEL PREP AND ANY ADDITIONAL PRE OP INSTRUCTIONS YOU RECEIVED FROM YOUR SURGEON'S OFFICE!!!     Oral Hygiene is also important to reduce your risk of infection.                                    Remember - BRUSH YOUR TEETH THE MORNING  OF SURGERY WITH YOUR REGULAR TOOTHPASTE   Do NOT smoke after Midnight   Take these medicines the morning of surgery with A SIP OF WATER: amlodipine, tylenol if needed  DO NOT TAKE ANY ORAL DIABETIC MEDICATIONS DAY OF YOUR SURGERY  Bring CPAP mask and tubing day of surgery.                              You may not have any metal on your body including hair pins, jewelry, and body piercing             Do not wear make-up, lotions, powders, perfumes/cologne, or deodorant  Do not wear nail polish including gel and S&S, artificial/acrylic nails, or any other type of covering on natural nails including finger and toenails. If you have artificial nails, gel coating, etc. that needs to be removed by a nail salon please have this removed prior to surgery or surgery may need to be canceled/ delayed if the surgeon/ anesthesia feels like they are unable to be safely monitored.   Do not shave  48 hours prior to surgery.                 Do not bring valuables to the hospital. [place].   Contacts, dentures or bridgework may not be worn into surgery.     DO NOT [place]. PHARMACY WILL DISPENSE MEDICATIONS LISTED ON YOUR MEDICATION LIST TO YOU DURING YOUR ADMISSION [place]!    Patients discharged on the day of surgery will not be allowed to drive home.  Someone NEEDS to stay with you for the first 24 hours after anesthesia.                Please read over the following fact sheets you were given: IF YOU HAVE QUESTIONS ABOUT YOUR PRE-OP INSTRUCTIONS PLEASE CALL [phone]     [facility] - Preparing for Surgery Before surgery, you can play an  important role.  Because skin is not sterile, your skin needs to be as free of germs as possible.  You can reduce the number of germs on your skin by washing with CHG (chlorahexidine gluconate) soap before surgery.  CHG is an antiseptic cleaner which kills germs and bonds with the skin to continue killing germs even after washing. Please DO NOT use if you have an allergy to CHG or antibacterial soaps.  If your skin becomes reddened/irritated stop using the CHG and inform your nurse when you arrive at Short Stay. Do not shave (including legs and underarms) for at least 48 hours prior to the first CHG shower.  You may shave your face/neck. Please follow these instructions carefully:  1.  Shower with CHG Soap the night before surgery and the  morning of Surgery.  2.  If you choose to wash your hair, wash your hair first as usual with your  normal  shampoo.  3.  After you shampoo, rinse your hair and body thoroughly to remove the  shampoo.                           4.  Use CHG as you would any other liquid soap.  You can apply chg directly  to the skin and wash  Gently with a scrungie or clean washcloth.  5.  Apply the CHG Soap to your body ONLY FROM THE NECK DOWN.   Do not use on face/ open                           Wound or open sores. Avoid contact with eyes, ears mouth and genitals (private parts).                       Wash face,  Genitals (private parts) with your normal soap.             6.  Wash thoroughly, paying special attention to the area where your surgery  will be performed.  7.  Thoroughly rinse your body with warm water from the neck down.  8.  DO NOT shower/wash with your normal soap after using and rinsing off  the CHG Soap.                9.  Pat yourself dry with a clean towel.            10.  Wear clean pajamas.            11.  Place clean sheets on your bed the night of your first shower and do not  sleep with pets. Day of Surgery : Do not apply any  lotions/deodorants the morning of surgery.  Please wear clean clothes to the hospital/surgery center.  FAILURE TO FOLLOW THESE INSTRUCTIONS MAY RESULT IN THE CANCELLATION OF YOUR SURGERY PATIENT SIGNATURE_________________________________  NURSE SIGNATURE__________________________________  ________________________________________________________________________

## 2022-04-30 ENCOUNTER — Encounter (HOSPITAL_COMMUNITY): Payer: Self-pay

## 2022-04-30 ENCOUNTER — Encounter (HOSPITAL_COMMUNITY)
Admission: RE | Admit: 2022-04-30 | Discharge: 2022-04-30 | Disposition: A | Discharge status: Home / Self Care | Payer: BC Managed Care – PPO | Source: Ambulatory Visit | Attending: Surgery | Admitting: Surgery

## 2022-04-30 ENCOUNTER — Other Ambulatory Visit: Payer: Self-pay

## 2022-04-30 VITALS — BP 138/70 | HR 71 | Temp 98.7°F | Resp 16 | Ht 65.0 in | Wt 142.0 lb

## 2022-04-30 DIAGNOSIS — A630 Anogenital (venereal) warts: Secondary | ICD-10-CM | POA: Diagnosis not present

## 2022-04-30 DIAGNOSIS — Z720 Tobacco use: Secondary | ICD-10-CM

## 2022-04-30 DIAGNOSIS — Z01818 Encounter for other preprocedural examination: Secondary | ICD-10-CM | POA: Insufficient documentation

## 2022-04-30 DIAGNOSIS — R87613 High grade squamous intraepithelial lesion on cytologic smear of cervix (HGSIL): Secondary | ICD-10-CM | POA: Diagnosis not present

## 2022-04-30 DIAGNOSIS — I10 Essential (primary) hypertension: Secondary | ICD-10-CM | POA: Diagnosis not present

## 2022-04-30 HISTORY — DX: Essential (primary) hypertension: I10

## 2022-04-30 LAB — BASIC METABOLIC PANEL
Anion gap: 8 (ref 5–15)
BUN: 14 mg/dL (ref 6–20)
CO2: 27 mmol/L (ref 22–32)
Calcium: 9.8 mg/dL (ref 8.9–10.3)
Chloride: 105 mmol/L (ref 98–111)
Creatinine, Ser: 0.83 mg/dL (ref 0.44–1.00)
GFR, Estimated: >60 mL/min (ref >60)
Glucose, Bld: 89 mg/dL (ref 70–99)
Potassium: 4.7 mmol/L (ref 3.5–5.1)
Sodium: 140 mmol/L (ref 135–145)

## 2022-04-30 LAB — CBC
HCT: 47.1 % — ABNORMAL HIGH (ref 36.0–46.0)
Hemoglobin: 15.9 g/dL — ABNORMAL HIGH (ref 12.0–15.0)
MCH: 31.4 pg (ref 26.0–34.0)
MCHC: 33.8 g/dL (ref 30.0–36.0)
MCV: 93.1 fL (ref 80.0–100.0)
Platelets: 267 10*3/uL (ref 150–400)
RBC: 5.06 MIL/uL (ref 3.87–5.11)
RDW: 13.8 % (ref 11.5–15.5)
WBC: 10.1 10*3/uL (ref 4.0–10.5)
nRBC: 0 % (ref 0.0–0.2)

## 2022-05-02 ENCOUNTER — Other Ambulatory Visit: Payer: Self-pay

## 2022-05-02 ENCOUNTER — Encounter (HOSPITAL_COMMUNITY): Payer: Self-pay | Admitting: Surgery

## 2022-05-02 ENCOUNTER — Ambulatory Visit (HOSPITAL_COMMUNITY): Payer: BC Managed Care – PPO | Admitting: Certified Registered"

## 2022-05-02 ENCOUNTER — Encounter (HOSPITAL_COMMUNITY)
Admission: RE | Disposition: A | Discharge status: Home / Self Care | Payer: Self-pay | Source: Ambulatory Visit | Attending: Surgery

## 2022-05-02 ENCOUNTER — Ambulatory Visit (HOSPITAL_COMMUNITY)
Admission: RE | Admit: 2022-05-02 | Discharge: 2022-05-02 | Disposition: A | Discharge status: Home / Self Care | Payer: BC Managed Care – PPO | Source: Ambulatory Visit | Attending: Surgery | Admitting: Surgery

## 2022-05-02 DIAGNOSIS — R85613 High grade squamous intraepithelial lesion on cytologic smear of anus (HGSIL): Secondary | ICD-10-CM | POA: Diagnosis not present

## 2022-05-02 DIAGNOSIS — R87613 High grade squamous intraepithelial lesion on cytologic smear of cervix (HGSIL): Secondary | ICD-10-CM | POA: Diagnosis not present

## 2022-05-02 DIAGNOSIS — K629 Disease of anus and rectum, unspecified: Secondary | ICD-10-CM | POA: Diagnosis not present

## 2022-05-02 DIAGNOSIS — A630 Anogenital (venereal) warts: Secondary | ICD-10-CM | POA: Insufficient documentation

## 2022-05-02 DIAGNOSIS — D013 Carcinoma in situ of anus and anal canal: Secondary | ICD-10-CM | POA: Diagnosis not present

## 2022-05-02 DIAGNOSIS — I10 Essential (primary) hypertension: Secondary | ICD-10-CM | POA: Insufficient documentation

## 2022-05-02 LAB — POCT PREGNANCY, URINE: Preg Test, Ur: NEGATIVE

## 2022-05-02 SURGERY — EXAM UNDER ANESTHESIA
Anesthesia: General

## 2022-05-02 MED ORDER — ORAL CARE MOUTH RINSE: 15.0000 mL | Freq: Once | OROMUCOSAL | Status: AC

## 2022-05-02 MED ORDER — PROPOFOL 10 MG/ML IV BOLUS
INTRAVENOUS | Status: AC
  Filled 2022-05-02: qty 20

## 2022-05-02 MED ORDER — ACETAMINOPHEN 500 MG PO TABS
1000.0000 mg | ORAL_TABLET | Freq: Once | ORAL | Status: AC
  Administered 2022-05-02: 1000 mg via ORAL
  Filled 2022-05-02: qty 2

## 2022-05-02 MED ORDER — LACTATED RINGERS IV SOLN: INTRAVENOUS | Status: DC

## 2022-05-02 MED ORDER — BUPIVACAINE LIPOSOME 1.3 % IJ SUSP
INTRAMUSCULAR | Status: AC
  Filled 2022-05-02: qty 20

## 2022-05-02 MED ORDER — MIDAZOLAM HCL 2 MG/2ML IJ SOLN
INTRAMUSCULAR | Status: AC
  Filled 2022-05-02: qty 2

## 2022-05-02 MED ORDER — SUGAMMADEX SODIUM 200 MG/2ML IV SOLN
INTRAVENOUS | Status: DC | PRN
  Administered 2022-05-02: 200 mg via INTRAVENOUS

## 2022-05-02 MED ORDER — ROCURONIUM BROMIDE 10 MG/ML (PF) SYRINGE
PREFILLED_SYRINGE | INTRAVENOUS | Status: AC
  Filled 2022-05-02: qty 10

## 2022-05-02 MED ORDER — BUPIVACAINE-EPINEPHRINE (PF) 0.25% -1:200000 IJ SOLN
INTRAMUSCULAR | Status: DC | PRN
  Administered 2022-05-02: 10 mL

## 2022-05-02 MED ORDER — BUPIVACAINE-EPINEPHRINE (PF) 0.25% -1:200000 IJ SOLN
INTRAMUSCULAR | Status: AC
  Filled 2022-05-02: qty 30

## 2022-05-02 MED ORDER — AMISULPRIDE (ANTIEMETIC) 5 MG/2ML IV SOLN: 10.0000 mg | Freq: Once | INTRAVENOUS | Status: DC | PRN

## 2022-05-02 MED ORDER — ONDANSETRON HCL 4 MG/2ML IJ SOLN
INTRAMUSCULAR | Status: DC | PRN
  Administered 2022-05-02: 4 mg via INTRAVENOUS

## 2022-05-02 MED ORDER — EPHEDRINE SULFATE-NACL 50-0.9 MG/10ML-% IV SOSY
PREFILLED_SYRINGE | INTRAVENOUS | Status: DC | PRN
  Administered 2022-05-02: 10 mg via INTRAVENOUS

## 2022-05-02 MED ORDER — DEXAMETHASONE SODIUM PHOSPHATE 10 MG/ML IJ SOLN
INTRAMUSCULAR | Status: AC
  Filled 2022-05-02: qty 1

## 2022-05-02 MED ORDER — FENTANYL CITRATE (PF) 250 MCG/5ML IJ SOLN
INTRAMUSCULAR | Status: DC | PRN
  Administered 2022-05-02: 100 ug via INTRAVENOUS

## 2022-05-02 MED ORDER — CHLORHEXIDINE GLUCONATE 0.12 % MT SOLN
15.0000 mL | Freq: Once | OROMUCOSAL | Status: AC
  Administered 2022-05-02: 15 mL via OROMUCOSAL

## 2022-05-02 MED ORDER — DEXAMETHASONE SODIUM PHOSPHATE 10 MG/ML IJ SOLN
INTRAMUSCULAR | Status: DC | PRN
  Administered 2022-05-02: 8 mg via INTRAVENOUS

## 2022-05-02 MED ORDER — PROPOFOL 10 MG/ML IV BOLUS
INTRAVENOUS | Status: DC | PRN
  Administered 2022-05-02: 200 mg via INTRAVENOUS

## 2022-05-02 MED ORDER — LIDOCAINE 2% (20 MG/ML) 5 ML SYRINGE
INTRAMUSCULAR | Status: DC | PRN
  Administered 2022-05-02: 100 mg via INTRAVENOUS

## 2022-05-02 MED ORDER — ROCURONIUM BROMIDE 10 MG/ML (PF) SYRINGE
PREFILLED_SYRINGE | INTRAVENOUS | Status: DC | PRN
  Administered 2022-05-02: 70 mg via INTRAVENOUS

## 2022-05-02 MED ORDER — FENTANYL CITRATE (PF) 100 MCG/2ML IJ SOLN
INTRAMUSCULAR | Status: AC
  Filled 2022-05-02: qty 2

## 2022-05-02 MED ORDER — SCOPOLAMINE 1 MG/3DAYS TD PT72
1.0000 | MEDICATED_PATCH | TRANSDERMAL | Status: DC
  Administered 2022-05-02: 1.5 mg via TRANSDERMAL
  Filled 2022-05-02: qty 1

## 2022-05-02 MED ORDER — ONDANSETRON HCL 4 MG/2ML IJ SOLN
INTRAMUSCULAR | Status: AC
  Filled 2022-05-02: qty 2

## 2022-05-02 MED ORDER — BUPIVACAINE LIPOSOME 1.3 % IJ SUSP
INTRAMUSCULAR | Status: DC | PRN
  Administered 2022-05-02: 10 mL

## 2022-05-02 MED ORDER — TRAMADOL HCL 50 MG PO TABS: 50.0000 mg | ORAL_TABLET | Freq: Four times a day (QID) | ORAL | 0 refills | Status: AC | PRN

## 2022-05-02 MED ORDER — FENTANYL CITRATE PF 50 MCG/ML IJ SOSY: 25.0000 ug | PREFILLED_SYRINGE | INTRAMUSCULAR | Status: DC | PRN

## 2022-05-02 MED ORDER — MIDAZOLAM HCL 2 MG/2ML IJ SOLN
INTRAMUSCULAR | Status: DC | PRN
  Administered 2022-05-02: 2 mg via INTRAVENOUS

## 2022-05-02 SURGICAL SUPPLY — 30 items
BAG COUNTER SPONGE SURGICOUNT (BAG) IMPLANT
DERMABOND ADVANCED (GAUZE/BANDAGES/DRESSINGS) ×1
DERMABOND ADVANCED .7 DNX12 (GAUZE/BANDAGES/DRESSINGS) IMPLANT
DRSG PAD ABDOMINAL 8X10 ST (GAUZE/BANDAGES/DRESSINGS) IMPLANT
ELECT NDL BLADE 2-5/6 (NEEDLE) ×1 IMPLANT
ELECT NEEDLE BLADE 2-5/6 (NEEDLE) ×2 IMPLANT
ELECT REM PT RETURN 15FT ADLT (MISCELLANEOUS) ×2 IMPLANT
GAUZE SPONGE 4X4 12PLY STRL (GAUZE/BANDAGES/DRESSINGS) IMPLANT
GLOVE BIO SURGEON STRL SZ7.5 (GLOVE) ×2 IMPLANT
GLOVE INDICATOR 8.0 STRL GRN (GLOVE) ×2 IMPLANT
GOWN STRL REUS W/ TWL XL LVL3 (GOWN DISPOSABLE) ×2 IMPLANT
GOWN STRL REUS W/TWL XL LVL3 (GOWN DISPOSABLE)
KIT BASIN OR (CUSTOM PROCEDURE TRAY) ×2 IMPLANT
KIT TURNOVER KIT A (KITS) IMPLANT
NEEDLE HYPO 22GX1.5 SAFETY (NEEDLE) ×2 IMPLANT
PACK GENERAL/GYN (CUSTOM PROCEDURE TRAY) ×2 IMPLANT
PANTS MESH DISP LRG (UNDERPADS AND DIAPERS) ×1 IMPLANT
PANTS MESH DISPOSABLE L (UNDERPADS AND DIAPERS)
SHEARS HARMONIC 9CM CVD (BLADE) IMPLANT
SPIKE FLUID TRANSFER (MISCELLANEOUS) ×2 IMPLANT
SPONGE SURGIFOAM ABS GEL 100 (HEMOSTASIS) IMPLANT
SURGILUBE 2OZ TUBE FLIPTOP (MISCELLANEOUS) ×2 IMPLANT
SUT CHROMIC 2 0 SH (SUTURE) IMPLANT
SUT CHROMIC 3 0 SH 27 (SUTURE) ×1 IMPLANT
SUT MNCRL AB 4-0 PS2 18 (SUTURE) ×1 IMPLANT
SUT VIC AB 3-0 SH 27 (SUTURE)
SUT VIC AB 3-0 SH 27X BRD (SUTURE) ×1 IMPLANT
SYR 20ML LL LF (SYRINGE) ×2 IMPLANT
TOWEL OR 17X26 10 PK STRL BLUE (TOWEL DISPOSABLE) ×2 IMPLANT
TOWEL OR NON WOVEN STRL DISP B (DISPOSABLE) ×2 IMPLANT

## 2022-05-02 NOTE — Discharge Instructions (Addendum)
ANORECTAL SURGERY: POST OP INSTRUCTIONS  DIET: Follow a light bland diet the first 24 hours after arrival home, such as soup, liquids, crackers, etc.  Be sure to include lots of fluids daily.  Avoid fast food or heavy meals as your are more likely to get nauseated.  Eat a low fat diet the next few days after surgery.   Some bleeding with bowel movements is expected for the first couple of days but this should stop in between bowel movements  Take your usually prescribed home medications unless otherwise directed. No foreign bodies per rectum for the next 3 months (enemas, etc)  PAIN CONTROL: It is helpful to take an over-the-counter pain medication regularly for the first few days/weeks.  Choose from the following that works best for you: Ibuprofen (Advil, etc) Three 200mg tabs every 6 hours as needed. Acetaminophen (Tylenol, etc) 500-650mg every 6 hours as needed NOTE: You may take both of these medications together - most patients find it most helpful when alternating between the two (i.e. Ibuprofen at 6am, tylenol at 9am, ibuprofen at 12pm ...) A  prescription for pain medication may have been prescribed for you at discharge.  Take your pain medication as prescribed.  If you are having problems/concerns with the prescription medicine, please call us for further advice.  Avoid getting constipated.  Between the surgery and the pain medications, it is common to experience some constipation.  Increasing fluid intake (64oz of water per day) and taking a fiber supplement (such as Metamucil, Citrucel, FiberCon) 1-2 times a day regularly will usually help prevent this problem from occurring.  Take Miralax (over the counter) 1-2x/day while taking a narcotic pain medication. If no bowel movement after 48hours, you may additionally take a laxative like a bottle of Milk of Magnesia which can be purchased over the counter. Avoid enemas.   Watch out for diarrhea.  If you have many loose bowel movements,  simplify your diet to bland foods.  Stop any stool softeners and decrease your fiber supplement. If this worsens or does not improve, please call us.  Wash / shower every day.  If you were discharged with a dressing, you may remove this the day after your surgery. You may shower normally, getting soap/water on your wound, particularly after bowel movements.  Soaking in a warm bath filled a couple inches ("Sitz bath") is a great way to clean the area after a bowel movement and many patients find it is a way to soothe the area.  ACTIVITIES as tolerated:   You may resume regular (light) daily activities beginning the next day--such as daily self-care, walking, climbing stairs--gradually increasing activities as tolerated.  If you can walk 30 minutes without difficulty, it is safe to try more intense activity such as jogging, treadmill, bicycling, low-impact aerobics, etc. Refrain from any heavy lifting or straining for the first 2 weeks after your procedure, particularly if your surgery was for hemorrhoids. Avoid activities that make your pain worse You may drive when you are no longer taking prescription pain medication, you can comfortably wear a seatbelt, and you can safely maneuver your car and apply brakes.  FOLLOW UP in our office Please call [facility] at [phone] to set up an appointment to see your surgeon in the office for a follow-up appointment approximately 2 weeks after your surgery. Make sure that you call for this appointment the day you arrive home to insure a convenient appointment time.  9. If you have disability or family leave forms   that need to be completed, you may have them completed by your primary care physician's office; for return to work instructions, please ask our office staff and they will be happy to assist you in obtaining this documentation   When to call us [phone]: Poor pain control Reactions / problems with new medications (rash/itching, etc)  Fever over  101.5 F (38.5 C) Inability to urinate Nausea/vomiting Worsening swelling or bruising Continued bleeding from incision. Increased pain, redness, or drainage from the incision  The clinic staff is available to answer your questions during regular business hours (8:30am-5pm).  Please don't hesitate to call and ask to speak to one of our nurses for clinical concerns.   A surgeon from [facility] is always on call at the hospitals   If you have a medical emergency, go to the nearest emergency room or call 911.   [facility] [address], [address], [place], [place]  [address] MAIN: [phone] FAX: [phone] [email or web address] 

## 2022-05-02 NOTE — Anesthesia Procedure Notes (Signed)
Procedure Name: Intubation Date/Time: 05/02/2022 9:47 AM  Performed by: Eben Burow, CRNAPre-anesthesia Checklist: Patient identified, Emergency Drugs available, Suction available, Patient being monitored and Timeout performed Patient Re-evaluated:Patient Re-evaluated prior to induction Oxygen Delivery Method: Circle system utilized Preoxygenation: Pre-oxygenation with 100% oxygen Induction Type: IV induction Ventilation: Mask ventilation without difficulty Laryngoscope Size: Mac and 4 Grade View: Grade I Tube type: Oral Tube size: 7.0 mm Number of attempts: 1 Airway Equipment and Method: Stylet Placement Confirmation: ETT inserted through vocal cords under direct vision, positive ETCO2 and breath sounds checked- equal and bilateral Secured at: 21 cm Tube secured with: Tape Dental Injury: Teeth and Oropharynx as per pre-operative assessment

## 2022-05-02 NOTE — H&P (Signed)
CC: Here today for surgery  HPI: Jasmine Le is an 46 y.o. female with history of hereditary angioedema, HTN, whom is seen in the office today as a referral by Dr. Jacalyn Lefevre for evaluation of abnormality identified during anorectal polypoid lesion removal during her colonoscopy.  She underwent colonoscopy for screening purposes 02/13/2022 with Dr Lorenso Courier. This was her first colonoscopy. This demonstrated:  Total of 6 small polyps removed from the colon. There is a 5 mm polyp in the rectum that was removed with a cold snare. Looking at the colonoscopy images, this was seen in the proximalmost portion of the anal canal on retroflexion.  Pathology: Sessile serrated polyps in the colon removed. 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 No invasive carcinoma was identified. She was referred to see [place].  She denies any complaints today. She denies any symptoms that preceded or followed her colonoscopy. No history of anorectal pain or bleeding. She does have a history of abnormal Pap smears and reports when she was around the age of 15 she had a local/cervical type procedure for removal of an abnormal lesion there. She follows regularly with her gynecologist.  She denies any changes in her health or health history since we met in the office. States she is ready for surgery. Procedure moved to main OR per anesthesia preference given her history of angioedema   PMH: Hereditary angioedema, HTN, abnormal Pap smears remotely  PSH: Denies any prior anorectal procedures or surgeries; hx of breast lumpectomy  FHx: Denies any known family history of colorectal, breast, endometrial or ovarian cancer  Social Hx: Denies use of tobacco/EtOH/illicit drug.   Review of Systems: A complete review of systems was obtained from the patient. I have reviewed this information and discussed as appropriate with the patient. See HPI as well for other ROS.  Past Medical History:  Diagnosis  Date   Angio-edema    Hypertension    Pneumonia    Urticaria     Past Surgical History:  Procedure Laterality Date   BREAST LUMPECTOMY     BREAST LUMPECTOMY WITH RADIOACTIVE SEED LOCALIZATION Right 02/01/2020   Procedure: RIGHT BREAST LUMPECTOMY WITH RADIOACTIVE SEED LOCALIZATION;  Surgeon: Coralie Keens, MD;  Location: [facility];  Service: General;  Laterality: Right;  LMA   COLONOSCOPY WITH PROPOFOL N/A 02/13/2022   Procedure: COLONOSCOPY WITH PROPOFOL;  Surgeon: Sharyn Creamer, MD;  Location: [facility];  Service: Gastroenterology;  Laterality: N/A;  [facility] case for Airway protection   POLYPECTOMY  02/13/2022   Procedure: POLYPECTOMY;  Surgeon: Sharyn Creamer, MD;  Location: [facility];  Service: Gastroenterology;;   WISDOM TOOTH EXTRACTION      Family History  Problem Relation Age of Onset   Asthma Mother    Allergic rhinitis Neg Hx    Eczema Neg Hx    Urticaria Neg Hx    Cancer - Colon Neg Hx    Stomach cancer Neg Hx    Esophageal cancer Neg Hx    Pancreatic cancer Neg Hx     Social:  reports that she has been smoking cigarettes. She has been smoking an average of 1 pack per day. She has never used smokeless tobacco. She reports current alcohol use. She reports that she does not use drugs.  Allergies:  Allergies  Allergen Reactions   Thiethylperazine Anaphylaxis   Ace Inhibitors Swelling   Advil [Ibuprofen] Nausea And Vomiting    In high doses   E-Mycin [Erythromycin] Nausea  And Vomiting    Medications: I have reviewed the patient's current medications.  Results for orders placed or performed during the hospital encounter of 04/30/22 (from the past 48 hour(s))  Basic metabolic panel per protocol     Status: None   Collection Time: 04/30/22  9:30 AM  Result Value Ref Range   Sodium 140 135 - 145 mmol/L   Potassium 4.7 3.5 - 5.1 mmol/L   Chloride 105 98 - 111 mmol/L   CO2 27 22 - 32 mmol/L   Glucose, Bld 89 70 - 99 mg/dL    Comment: Glucose  reference range applies only to samples taken after fasting for at least 8 hours.   BUN 14 6 - 20 mg/dL   Creatinine, Ser 0.83 0.44 - 1.00 mg/dL   Calcium 9.8 8.9 - 10.3 mg/dL   GFR, Estimated >60 >60 mL/min    Comment: (NOTE) Calculated using the CKD-EPI Creatinine Equation (2021)    Anion gap 8 5 - 15    Comment: Performed at [facility], [place] [address], [place], [place] [address]  CBC per protocol     Status: Abnormal   Collection Time: 04/30/22  9:30 AM  Result Value Ref Range   WBC 10.1 4.0 - 10.5 K/uL   RBC 5.06 3.87 - 5.11 MIL/uL   Hemoglobin 15.9 (H) 12.0 - 15.0 g/dL   HCT 47.1 (H) 36.0 - 46.0 %   MCV 93.1 80.0 - 100.0 fL   MCH 31.4 26.0 - 34.0 pg   MCHC 33.8 30.0 - 36.0 g/dL   RDW 13.8 11.5 - 15.5 %   Platelets 267 150 - 400 K/uL   nRBC 0.0 0.0 - 0.2 %    Comment: Performed at [facility], [place] [address], [place], [place] [address]    No results found.  ROS - all of the below systems have been reviewed with the patient and positives are indicated with bold text General: chills, fever or night sweats Eyes: blurry vision or double vision ENT: epistaxis or sore throat Allergy/Immunology: itchy/watery eyes or nasal congestion Hematologic/Lymphatic: bleeding problems, blood clots or swollen lymph nodes Endocrine: temperature intolerance or unexpected weight changes Breast: new or changing breast lumps or nipple discharge Resp: cough, shortness of breath, or wheezing CV: chest pain or dyspnea on exertion GI: as per HPI GU: dysuria, trouble voiding, or hematuria MSK: joint pain or joint stiffness Neuro: TIA or stroke symptoms Derm: pruritus and skin lesion changes Psych: anxiety and depression  PE There were no vitals taken for this visit. Constitutional: NAD; conversant Eyes: Moist conjunctiva; no lid lag Neck: Trachea midline; no thyromegaly Lungs: Normal respiratory effort; no tactile fremitus CV: RRR MSK: Normal range  of motion of extremities Psychiatric: Appropriate affect; alert and oriented x3  Results for orders placed or performed during the hospital encounter of 04/30/22 (from the past 48 hour(s))  Basic metabolic panel per protocol     Status: None   Collection Time: 04/30/22  9:30 AM  Result Value Ref Range   Sodium 140 135 - 145 mmol/L   Potassium 4.7 3.5 - 5.1 mmol/L   Chloride 105 98 - 111 mmol/L   CO2 27 22 - 32 mmol/L   Glucose, Bld 89 70 - 99 mg/dL    Comment: Glucose reference range applies only to samples taken after fasting for at least 8 hours.   BUN 14 6 - 20 mg/dL   Creatinine, Ser 0.83 0.44 - 1.00 mg/dL   Calcium  9.8 8.9 - 10.3 mg/dL   GFR, Estimated >60 >60 mL/min    Comment: (NOTE) Calculated using the CKD-EPI Creatinine Equation (2021)    Anion gap 8 5 - 15    Comment: Performed at [facility], [place] [address] [place], [place] [address]  CBC per protocol     Status: Abnormal   Collection Time: 04/30/22  9:30 AM  Result Value Ref Range   WBC 10.1 4.0 - 10.5 K/uL   RBC 5.06 3.87 - 5.11 MIL/uL   Hemoglobin 15.9 (H) 12.0 - 15.0 g/dL   HCT 47.1 (H) 36.0 - 46.0 %   MCV 93.1 80.0 - 100.0 fL   MCH 31.4 26.0 - 34.0 pg   MCHC 33.8 30.0 - 36.0 g/dL   RDW 13.8 11.5 - 15.5 %   Platelets 267 150 - 400 K/uL   nRBC 0.0 0.0 - 0.2 %    Comment: Performed at [facility], [place] [address], [place], [place] [address]    No results found.  A/P: Jasmine Le is an 46 y.o. female with hx of HTN, hereditary angioedema here for evaluation of HGSIL/AIN3 found on condylomatous tissue removed from proximal anal canal on routine colonoscopy  -The anatomy and physiology of the anal canal was discussed with the patient with associated pictures. The pathophysiology of condyloma, HGSIL was discussed at length with associated pictures and illustrations. We discussed that this is caused by the human papilloma virus. We discussed that this is a sexually  transmitted infection. We had offered a one-time screening for HIV but she states she has recently had this in opted not to pursue. -We have a relatively limited evaluation today with regards to the proximal anal canal. Given the finding of HGSIL, we discussed proceeding with anorectal exam under anesthesia and excision/fulguration of any abnormal tissue.  -The planned procedure, material risks (including, but not limited to, pain, bleeding, infection, scarring, need for blood transfusion, need for additional procedures, recurrence, pneumonia, heart attack, stroke, death) benefits and alternatives to surgery were discussed at length. The patient's questions were answered to her satisfaction, she voiced understanding and elected to proceed with surgery. Additionally, we discussed typical postoperative expectations and the recovery process.  -Long-term, we discussed the importance of surveillance moving forward. We also discussed the lifetime risks for anal cancer and should she ever develop any suspicious findings including which she could believed to be her hemorrhoids, the importance of getting this evaluated.   Nadeen Landau, [facility], [place]

## 2022-05-02 NOTE — Transfer of Care (Signed)
Immediate Anesthesia Transfer of Care Note  Patient: Jasmine Le  Procedure(s) Performed: ANORECTAL EXAM UNDER ANESTHESIA, TRANSANAL EXCISION OF ANAL CANAL POLYP; EXCISION OF PERINEAL LESION  Patient Location: PACU  Anesthesia Type:General  Level of Consciousness: awake, alert  and patient cooperative  Airway & Oxygen Therapy: Patient Spontanous Breathing and Patient connected to face mask oxygen  Post-op Assessment: Report given to RN and Post -op Vital signs reviewed and stable  Post vital signs: Reviewed and stable  Last Vitals:  Vitals Value Taken Time  BP    Temp    Pulse 93 05/02/22 1037  Resp 20 05/02/22 1037  SpO2 100 % 05/02/22 1037  Vitals shown include unvalidated device data.  Last Pain:  Vitals:   05/02/22 0822  TempSrc: Oral  PainSc:       Patients Stated Pain Goal: 3 (23/36/12 2449)  Complications: No notable events documented.

## 2022-05-02 NOTE — Anesthesia Preprocedure Evaluation (Addendum)
Anesthesia Evaluation  Patient identified by MRN, date of birth, ID band Patient awake    Reviewed: Allergy & Precautions, NPO status , Patient's Chart, lab work & pertinent test results  History of Anesthesia Complications Negative for: history of anesthetic complications  Airway Mallampati: II  TM Distance: >3 FB Neck ROM: Full    Dental no notable dental hx. (+) Dental Advisory Given   Pulmonary Current Smoker,    Pulmonary exam normal        Cardiovascular hypertension, Pt. on medications Normal cardiovascular exam     Neuro/Psych negative neurological ROS  negative psych ROS   GI/Hepatic Neg liver ROS,   Endo/Other  negative endocrine ROS  Renal/GU negative Renal ROS     Musculoskeletal negative musculoskeletal ROS (+)   Abdominal   Peds  Hematology negative hematology ROS (+)   Anesthesia Other Findings   Reproductive/Obstetrics                            Anesthesia Physical  Anesthesia Plan  ASA: 2  Anesthesia Plan: General   Post-op Pain Management: Celebrex PO (pre-op)* and Tylenol PO (pre-op)*   Induction: Intravenous  PONV Risk Score and Plan: 0 and 3 and Ondansetron, Dexamethasone, Midazolam, Scopolamine patch - Pre-op and Treatment may vary due to age or medical condition  Airway Management Planned: Oral ETT  Additional Equipment: None  Intra-op Plan:   Post-operative Plan: Extubation in OR  Informed Consent: I have reviewed the patients History and Physical, chart, labs and discussed the procedure including the risks, benefits and alternatives for the proposed anesthesia with the patient or authorized representative who has indicated his/her understanding and acceptance.     Dental advisory given  Plan Discussed with: Anesthesiologist, CRNA and Surgeon  Anesthesia Plan Comments:       Anesthesia Quick Evaluation

## 2022-05-02 NOTE — Op Note (Signed)
05/02/2022  10:21 AM  PATIENT:  Jasmine Le  46 y.o. female  Patient Care Team: Michael Boston, MD as PCP - General (Internal Medicine)  PRE-OPERATIVE DIAGNOSIS:  High grade anal squamous intraepithelial lesion (HGSIL) / Anal intraepithelial neoplasia III (AIN 3)  POST-OPERATIVE DIAGNOSIS:  Same  PROCEDURE:   Transanal excision of anal canal polypoid lesion under anoscopy Excision of skin lesion from perineum  SURGEON:  Surgeon(s): Ileana Roup, MD  ASSISTANT: OR staff   ANESTHESIA:   local and general  SPECIMEN:   Anal canal polypoid lesion - left posterior anal canal Perineal skin lesion - left anterior  DISPOSITION OF SPECIMEN:  PATHOLOGY  COUNTS:  Sponge, needle, and instrument counts were reported correct x2 at conclusion.  EBL: 2 mL  Drains: None  PLAN OF CARE: Discharge to home after PACU  PATIENT DISPOSITION:  PACU - hemodynamically stable.  OR FINDINGS: On the perineum and the left anterior location, there is a small approximate 5 x 5 mm lesion that is somewhat heaped up.  Given her history of HGSIL, we opted to excise this for pathologic evaluation and rule out condyloma.  Within the anal canal, there is 1 single polypoid lesion in the left posterior position.  This was excised under endoscopic guidance and also submitted for pathology.  DESCRIPTION: The patient was identified in the preoperative holding area and taken to the OR. SCDs were applied.  She then underwent general endotracheal anesthesia without difficulty. The patient was then rolled onto the OR table in the prone jackknife position. Pressure points were then evaluated and padded. Benzoin was applied to the buttocks and they were gently taped apart.  She was then prepped and draped in usual sterile fashion.  A surgical timeout was performed indicating the correct patient, procedure, and positioning.  A perianal block was then created using a dilute mixture of 0.25% Marcaine with epinephrine  and Exparel.  Externally, there is a lesion on the perineum in the left anterior position.  This is somewhat heaped up and may be a tag/mole.  Given her history of HGSIL, we opted to excise this.  This is approximately 5 x 5 mm.  This was elevated with a forcep and excised sharply using a scalpel.  Hemostasis is achieved.  The wound was closed with a 3-0 chromic.  Dermabond is applied.  Attention is then directed at the anorectal portion of the procedure. A well lubricated digital rectal exam was performed. This demonstrated no palpable masses.  A Hill-Ferguson anoscope was into the anal canal and circumferential inspection demonstrated left posterior polypoid lesion in the anal canal.  This is somewhat exophytic in appearance but soft and mobile.  Did not have a clearly verrucous type appearance.  This was elevated with a forcep.  This was then excised sharply using electrocautery.  This was excised with grossly negative margins.  The defect is then fulgurated for hemostasis.  The wound is then closed with a running 3-0 chromic suture.  The anal canal was then again carefully inspected and there were no other lesions or abnormalities noted.  Additional local anesthetic is infiltrated.  All counts are reported correct.  The buttocks were then untaped.  A dressing consisting of 4 x 4's, ABD, and mesh underwear was placed.  She is rolled back onto a stretcher, wake from anesthesia, extubated, and transported to recovery in satisfactory condition  DISPOSITION: PACU in satisfactory condition.

## 2022-05-02 NOTE — Anesthesia Postprocedure Evaluation (Signed)
Anesthesia Post Note  Patient: Jasmine Le  Procedure(s) Performed: ANORECTAL EXAM UNDER ANESTHESIA, TRANSANAL EXCISION OF ANAL CANAL POLYP; EXCISION OF PERINEAL LESION     Patient location during evaluation: PACU Anesthesia Type: General Level of consciousness: sedated Pain management: pain level controlled Vital Signs Assessment: post-procedure vital signs reviewed and stable Respiratory status: spontaneous breathing and respiratory function stable Cardiovascular status: stable Postop Assessment: no apparent nausea or vomiting Anesthetic complications: no   No notable events documented.  Last Vitals:  Vitals:   05/02/22 1100 05/02/22 1115  BP: 113/78 (!) 128/91  Pulse: 70 70  Resp: 17 16  Temp: 36.7 C 36.7 C  SpO2: 100% 98%    Last Pain:  Vitals:   05/02/22 1115  TempSrc:   PainSc: 0-No pain                 Gaynor Ferreras DANIEL

## 2022-05-08 LAB — SURGICAL PATHOLOGY

## 2022-09-28 NOTE — Progress Notes (Unsigned)
Follow Up Note  RE: Jasmine Le MRN: [id number] DOB: January 11, 1976 Date of Office Visit: 09/29/2022  Referring provider: Michael Boston, MD Primary care provider: Michael Boston, MD  Chief Complaint: No chief complaint on file.  History of Present Illness: I had the pleasure of seeing Jasmine Le for a follow up visit at the [facility] on 09/28/2022. She is a 46 y.o. female, who is being followed for hereditary angioedema. Her previous allergy office visit was on 04/09/2022 with Dr. Maudie Mercury. Today is a regular follow up visit.  Hereditary angioedema (HCC) Past history - Diagnosed with HAE in her 61s in [place] after having frequent bouts of angioedema of her face, feet, hands, abdominal area. One episode of laryngeal swelling requiring ICU stay without intubation. Since her initially diagnosis, severe attacks have diminished and last ER visit was in 2019 requiring Berinert. However, she does have milder angioedema of hands and feet throughout the year. No previous prophylactic medications and does not have acute medicine on hand either. Previously she had prophylactic IV medications prior to an oral surgery 5-10 years ago and did well. No other family members with HAE.  Interim history - no swelling episodes since on Orladeyo. Had colonoscopy, dental cleaning, filling and mammogram with no attacks.  Please keep track of your attacks even mild ones. Continue Orladeyo - 1 pill orally daily for preventative measures. If it doesn't work as well there are many other options these days. Use Firazyr for acute attacks and go to ER after using the medication.  Make sure you take it to any procedural appointments you have - dental, mammogram, pap smears, colonoscopy. Recommend to continue to get colonoscopy in a hospital setting and NOT at an outpatient surgical center in case you have a large reaction.   Return in about 6 months (around 10/10/2022).  Assessment and Plan: Jasmine Le  is a 46 y.o. female with: No problem-specific Assessment & Plan notes found for this encounter.  No follow-ups on file.  No orders of the defined types were placed in this encounter.  Lab Orders  No laboratory test(s) ordered today    Diagnostics: Spirometry:  Tracings reviewed. Her effort: {Blank single:19197::"Good reproducible efforts.","It was hard to get consistent efforts and there is a question as to whether this reflects a maximal maneuver.","Poor effort, data can not be interpreted."} FVC: ***L FEV1: ***L, ***% predicted FEV1/FVC ratio: ***% Interpretation: {Blank single:19197::"Spirometry consistent with mild obstructive disease","Spirometry consistent with moderate obstructive disease","Spirometry consistent with severe obstructive disease","Spirometry consistent with possible restrictive disease","Spirometry consistent with mixed obstructive and restrictive disease","Spirometry uninterpretable due to technique","Spirometry consistent with normal pattern","No overt abnormalities noted given today's efforts"}.  Please see scanned spirometry results for details.  Skin Testing: {Blank single:19197::"Select foods","Environmental allergy panel","Environmental allergy panel and select foods","Food allergy panel","None","Deferred due to recent antihistamines use"}. *** Results discussed with patient/family.   Medication List:  Current Outpatient Medications  Medication Sig Dispense Refill  . acetaminophen (TYLENOL) 500 MG tablet Take 500 mg by mouth every 6 (six) hours as needed (pain.).    Marland Kitchen amLODipine (NORVASC) 5 MG tablet Take 5 mg by mouth in the morning.    . fexofenadine (ALLEGRA) 180 MG tablet Take 180 mg by mouth at bedtime.    Marland Kitchen icatibant (FIRAZYR) 30 MG/3ML injection Inject 3 mLs (30 mg total) into the skin as needed. (Patient taking differently: Inject 30 mg into the skin as needed (attacks of angioedema).) 3 mL 3  . levonorgestrel (MIRENA) 20 MCG/24HR  IUD 1 each by  Intrauterine route once.     . Multiple Vitamins-Minerals (EMERGEN-C VITAMIN C) CHEW Chew 3 tablets by mouth daily with lunch.    . ORLADEYO 150 MG CAPS Take 150 capsules by mouth daily with supper.     No current facility-administered medications for this visit.   Allergies: Allergies  Allergen Reactions  . Thiethylperazine Anaphylaxis  . Ace Inhibitors Swelling  . Advil [Ibuprofen] Nausea And Vomiting    In high doses  . E-Mycin [Erythromycin] Nausea And Vomiting   I reviewed her past medical history, social history, family history, and environmental history and no significant changes have been reported from her previous visit.  Review of Systems  Constitutional:  Negative for appetite change, chills, fever and unexpected weight change.  HENT:  Negative for congestion and rhinorrhea.   Eyes:  Negative for itching.  Respiratory:  Negative for cough, chest tightness, shortness of breath and wheezing.   Cardiovascular:  Negative for chest pain.  Gastrointestinal:  Negative for abdominal pain.  Genitourinary:  Negative for difficulty urinating.  Skin:  Negative for rash.  Allergic/Immunologic: Negative for food allergies.  Neurological:  Negative for headaches.   Objective: There were no vitals taken for this visit. There is no height or weight on file to calculate BMI. Physical Exam Vitals and nursing note reviewed.  Constitutional:      Appearance: She is well-developed.  HENT:     Head: Normocephalic and atraumatic.     Right Ear: Tympanic membrane and external ear normal.     Left Ear: Tympanic membrane and external ear normal.     Nose: Nose normal.  Eyes:     Conjunctiva/sclera: Conjunctivae normal.  Cardiovascular:     Rate and Rhythm: Normal rate and regular rhythm.     Heart sounds: Normal heart sounds. No murmur heard.    No friction rub. No gallop.  Pulmonary:     Effort: Pulmonary effort is normal.     Breath sounds: Normal breath sounds. No wheezing or  rales.  Musculoskeletal:     Cervical back: Neck supple.  Skin:    General: Skin is warm.     Findings: No rash.  Neurological:     Mental Status: She is alert and oriented to person, place, and time.  Psychiatric:        Behavior: Behavior normal.  Previous notes and tests were reviewed. The plan was reviewed with the patient/family, and all questions/concerned were addressed.  It was my pleasure to see Elody today and participate in her care. Please feel free to contact me with any questions or concerns.  Sincerely,  [name], DO Allergy & Immunology  [facility] office: [place] office: [phone]

## 2022-09-29 ENCOUNTER — Other Ambulatory Visit: Payer: Self-pay

## 2022-09-29 ENCOUNTER — Encounter: Payer: Self-pay | Admitting: Allergy

## 2022-09-29 ENCOUNTER — Other Ambulatory Visit: Payer: Self-pay | Admitting: *Deleted

## 2022-09-29 ENCOUNTER — Ambulatory Visit (INDEPENDENT_AMBULATORY_CARE_PROVIDER_SITE_OTHER): Payer: BC Managed Care – PPO | Admitting: Allergy

## 2022-09-29 VITALS — BP 128/76 | HR 88 | Temp 98.7°F | Resp 14 | Ht 66.0 in | Wt 141.5 lb

## 2022-09-29 DIAGNOSIS — D841 Defects in the complement system: Secondary | ICD-10-CM | POA: Diagnosis not present

## 2022-09-29 MED ORDER — ICATIBANT ACETATE 30 MG/3ML ~~LOC~~ SOSY
30.0000 mg | PREFILLED_SYRINGE | SUBCUTANEOUS | 3 refills | Status: DC | PRN
Start: 2022-09-29 — End: 2024-03-15

## 2022-09-29 NOTE — Patient Instructions (Addendum)
Please keep track of your attacks even mild ones. Continue Orladeyo - 1 pill orally daily for preventative measures. If it doesn't work as well there are many other options these days. If your new insurance does not cover it then let us know.   Use Firazyr for acute attacks and go to ER after using the medication.  Make sure you take it to any procedural appointments you have - dental, mammogram, pap smears, colonoscopy. Will send in refill as your current one has expired.  Recommend to continue to get colonoscopy in a hospital setting and NOT at an outpatient surgical center in case you have a large reaction.  Follow up in 6 months or sooner if needed.

## 2022-09-29 NOTE — Assessment & Plan Note (Signed)
Past history - Diagnosed with HAE in her 66s in [place] after having frequent bouts of angioedema of her face, feet, hands, abdominal area. One episode of laryngeal swelling requiring ICU stay without intubation. Since her initial diagnosis, severe attacks have diminished and last ER visit was in 2019 requiring Berinert. However, she does have milder angioedema of hands and feet throughout the year. No previous prophylactic medications and does not have acute medicine on hand either. Previously she had prophylactic IV medications prior to an oral surgery 5-10 years ago and did well. No other family members with HAE. Had colonoscopy, dental cleaning, filling and mammogram with no attacks.  Interim history - no swelling episodes since on Orladeyo. Had milder swelling sensation after one filling since last OV. Had rectal polyp removed with no issues.  Please keep track of your attacks even mild ones. Continue Orladeyo - 1 pill orally daily for preventative measures. If it doesn't work as well there are many other options these days. If your new insurance does not cover it then let us know.  Use Firazyr for acute attacks and go to ER after using the medication.  Make sure you take it to any procedural appointments you have - dental, mammogram, pap smears, colonoscopy. Will send in refill as your current one has expired.  Recommend to continue to get colonoscopy in a hospital setting and NOT at an outpatient surgical center in case you have a severe reaction.

## 2022-10-13 ENCOUNTER — Ambulatory Visit: Payer: BC Managed Care – PPO | Admitting: Allergy

## 2023-03-29 NOTE — Progress Notes (Unsigned)
Follow Up Note  RE: Jasmine Le MRN: [id number] DOB: 05-23-1976 Date of Office Visit: 03/30/2023  Referring provider: Melida Quitter, MD Primary care provider: Melida Quitter, MD  Chief Complaint: No chief complaint on file.  History of Present Illness: I had the pleasure of seeing Jasmine Le for a follow up visit at the [facility] on 03/29/2023. She is a 47 y.o. female, who is being followed for hereditary angioedema. Her previous allergy office visit was on 09/29/2022 with Dr. Selena Batten. Today is a regular follow up visit.  Hereditary angioedema (HCC) Past history - Diagnosed with HAE in her 23s in [place] after having frequent bouts of angioedema of her face, feet, hands, abdominal area. One episode of laryngeal swelling requiring ICU stay without intubation. Since her initial diagnosis, severe attacks have diminished and last ER visit was in 2019 requiring Berinert. However, she does have milder angioedema of hands and feet throughout the year. No previous prophylactic medications and does not have acute medicine on hand either. Previously she had prophylactic IV medications prior to an oral surgery 5-10 years ago and did well. No other family members with HAE. Had colonoscopy, dental cleaning, filling and mammogram with no attacks.  Interim history - no swelling episodes since on Orladeyo. Had milder swelling sensation after one filling since last OV. Had rectal polyp removed with no issues.  Please keep track of your attacks even mild ones. Continue Orladeyo - 1 pill orally daily for preventative measures. If it doesn't work as well there are many other options these days. If your new insurance does not cover it then let us know.  Use Firazyr for acute attacks and go to ER after using the medication.  Make sure you take it to any procedural appointments you have - dental, mammogram, pap smears, colonoscopy. Will send in refill as your current one has expired.   Recommend to continue to get colonoscopy in a hospital setting and NOT at an outpatient surgical center in case you have a severe reaction.   Return in about 6 months (around 03/30/2023).  Assessment and Plan: Jasmine Le is a 47 y.o. female with: No problem-specific Assessment & Plan notes found for this encounter.  No follow-ups on file.  No orders of the defined types were placed in this encounter.  Lab Orders  No laboratory test(s) ordered today    Diagnostics: Spirometry:  Tracings reviewed. Her effort: {Blank single:19197::"Good reproducible efforts.","It was hard to get consistent efforts and there is a question as to whether this reflects a maximal maneuver.","Poor effort, data can not be interpreted."} FVC: ***L FEV1: ***L, ***% predicted FEV1/FVC ratio: ***% Interpretation: {Blank single:19197::"Spirometry consistent with mild obstructive disease","Spirometry consistent with moderate obstructive disease","Spirometry consistent with severe obstructive disease","Spirometry consistent with possible restrictive disease","Spirometry consistent with mixed obstructive and restrictive disease","Spirometry uninterpretable due to technique","Spirometry consistent with normal pattern","No overt abnormalities noted given today's efforts"}.  Please see scanned spirometry results for details.  Skin Testing: {Blank single:19197::"Select foods","Environmental allergy panel","Environmental allergy panel and select foods","Food allergy panel","None","Deferred due to recent antihistamines use"}. *** Results discussed with patient/family.   Medication List:  Current Outpatient Medications  Medication Sig Dispense Refill   amLODipine (NORVASC) 5 MG tablet Take 5 mg by mouth in the morning.     fexofenadine (ALLEGRA) 180 MG tablet Take 180 mg by mouth at bedtime.     icatibant (FIRAZYR) 30 MG/3ML injection Inject 3 mLs (30 mg total) into the skin as needed. (Patient taking differently:  Inject 30  mg into the skin as needed (attacks of angioedema).) 3 mL 3   icatibant Acetate (FIRAZYR) 30 MG/3ML injection Inject 3 mLs (30 mg total) into the skin as needed. 9 mL 3   levonorgestrel (MIRENA) 20 MCG/24HR IUD 1 each by Intrauterine route once.      Multiple Vitamins-Minerals (EMERGEN-C VITAMIN C) CHEW Chew 3 tablets by mouth daily with lunch.     ORLADEYO 150 MG CAPS Take 150 capsules by mouth daily with supper.     No current facility-administered medications for this visit.   Allergies: Allergies  Allergen Reactions   Thiethylperazine Anaphylaxis   Ace Inhibitors Swelling   Advil [Ibuprofen] Nausea And Vomiting    In high doses   E-Mycin [Erythromycin] Nausea And Vomiting   I reviewed her past medical history, social history, family history, and environmental history and no significant changes have been reported from her previous visit.  Review of Systems  Constitutional:  Negative for appetite change, chills, fever and unexpected weight change.  HENT:  Negative for congestion and rhinorrhea.   Eyes:  Negative for itching.  Respiratory:  Negative for cough, chest tightness, shortness of breath and wheezing.   Cardiovascular:  Negative for chest pain.  Gastrointestinal:  Negative for abdominal pain.  Genitourinary:  Negative for difficulty urinating.  Skin:  Negative for rash.  Allergic/Immunologic: Negative for food allergies.  Neurological:  Negative for headaches.    Objective: There were no vitals taken for this visit. There is no height or weight on file to calculate BMI. Physical Exam Vitals and nursing note reviewed.  Constitutional:      Appearance: She is well-developed.  HENT:     Head: Normocephalic and atraumatic.     Right Ear: Tympanic membrane and external ear normal.     Left Ear: Tympanic membrane and external ear normal.     Nose: Nose normal.  Eyes:     Conjunctiva/sclera: Conjunctivae normal.  Cardiovascular:     Rate and Rhythm: Normal rate and  regular rhythm.     Heart sounds: Normal heart sounds. No murmur heard.    No friction rub. No gallop.  Pulmonary:     Effort: Pulmonary effort is normal.     Breath sounds: Normal breath sounds. No wheezing or rales.  Musculoskeletal:     Cervical back: Neck supple.  Skin:    General: Skin is warm.     Findings: No rash.  Neurological:     Mental Status: She is alert and oriented to person, place, and time.  Psychiatric:        Behavior: Behavior normal.    Previous notes and tests were reviewed. The plan was reviewed with the patient/family, and all questions/concerned were addressed.  It was my pleasure to see Jasmine Le today and participate in her care. Please feel free to contact me with any questions or concerns.  Sincerely,  Wyline Mood, DO Allergy & Immunology  [facility] office: [phone] [facility] office: [phone]

## 2023-03-30 ENCOUNTER — Encounter: Payer: Self-pay | Admitting: Allergy

## 2023-03-30 ENCOUNTER — Other Ambulatory Visit: Payer: Self-pay

## 2023-03-30 ENCOUNTER — Ambulatory Visit (INDEPENDENT_AMBULATORY_CARE_PROVIDER_SITE_OTHER): Payer: BC Managed Care – PPO | Admitting: Allergy

## 2023-03-30 VITALS — BP 130/88 | HR 85 | Temp 98.2°F | Ht 66.0 in | Wt 141.9 lb

## 2023-03-30 DIAGNOSIS — D841 Defects in the complement system: Secondary | ICD-10-CM | POA: Diagnosis not present

## 2023-03-30 NOTE — Assessment & Plan Note (Signed)
Past history - Diagnosed with HAE in her 40s in [place] after having frequent bouts of angioedema of her face, feet, hands, abdominal area. One episode of laryngeal swelling requiring ICU stay without intubation. Since her initial diagnosis, severe attacks have diminished and last ER visit was in 2019 requiring Berinert. However, she does have milder angioedema of hands and feet throughout the year. No previous prophylactic medications and does not have acute medicine on hand either. Previously she had prophylactic IV medications prior to an oral surgery 5-10 years ago and did well. No other family members with HAE. Had colonoscopy, dental cleaning, filling and mammogram with no attacks.  Interim history - no swelling episodes since on Orladeyo. No procedures scheduled.  Please keep track of your attacks even mild ones. Continue Orladeyo - 1 pill orally daily for preventative measures. If it doesn't work as well there are many other options these days. Use Firazyr for acute attacks and go to ER after using the medication.  Make sure you take it to any procedural appointments you have - dental, mammogram, pap smears, colonoscopy. Recommend to continue to get colonoscopy in a hospital setting and NOT at an outpatient surgical center in case you have a large reaction.

## 2023-03-30 NOTE — Patient Instructions (Addendum)
Please keep track of your attacks even mild ones. Continue Orladeyo - 1 pill orally daily for preventative measures. If it doesn't work as well there are many other options these days.  Use Firazyr for acute attacks and go to ER after using the medication.  Make sure you take it to any procedural appointments you have - dental, mammogram, pap smears, colonoscopy. Recommend to continue to get colonoscopy in a hospital setting and NOT at an outpatient surgical center in case you have a large reaction.  Follow up in 6 months or sooner if needed.     

## 2023-08-12 ENCOUNTER — Emergency Department (HOSPITAL_COMMUNITY)
Admission: EM | Admit: 2023-08-12 | Discharge: 2023-08-13 | Disposition: A | Discharge status: Home / Self Care | Payer: BC Managed Care – PPO | Attending: Emergency Medicine | Admitting: Emergency Medicine

## 2023-08-12 ENCOUNTER — Other Ambulatory Visit: Payer: Self-pay

## 2023-08-12 DIAGNOSIS — Z79899 Other long term (current) drug therapy: Secondary | ICD-10-CM | POA: Insufficient documentation

## 2023-08-12 DIAGNOSIS — D841 Defects in the complement system: Secondary | ICD-10-CM | POA: Insufficient documentation

## 2023-08-12 DIAGNOSIS — M7989 Other specified soft tissue disorders: Secondary | ICD-10-CM | POA: Diagnosis present

## 2023-08-12 LAB — BASIC METABOLIC PANEL
Anion gap: 8 (ref 5–15)
BUN: 15 mg/dL (ref 6–20)
CO2: 26 mmol/L (ref 22–32)
Calcium: 9.5 mg/dL (ref 8.9–10.3)
Chloride: 102 mmol/L (ref 98–111)
Creatinine, Ser: 0.8 mg/dL (ref 0.44–1.00)
GFR, Estimated: >60 mL/min (ref >60)
Glucose, Bld: 129 mg/dL — ABNORMAL HIGH (ref 70–99)
Potassium: 3.8 mmol/L (ref 3.5–5.1)
Sodium: 136 mmol/L (ref 135–145)

## 2023-08-12 LAB — CBC
HCT: 42.5 % (ref 36.0–46.0)
Hemoglobin: 14.3 g/dL (ref 12.0–15.0)
MCH: 30.6 pg (ref 26.0–34.0)
MCHC: 33.6 g/dL (ref 30.0–36.0)
MCV: 90.8 fL (ref 80.0–100.0)
Platelets: 348 10*3/uL (ref 150–400)
RBC: 4.68 MIL/uL (ref 3.87–5.11)
RDW: 13.2 % (ref 11.5–15.5)
WBC: 18.5 10*3/uL — ABNORMAL HIGH (ref 4.0–10.5)
nRBC: 0 % (ref 0.0–0.2)

## 2023-08-12 LAB — BRAIN NATRIURETIC PEPTIDE: B Natriuretic Peptide: 17.9 pg/mL (ref 0.0–100.0)

## 2023-08-12 NOTE — ED Triage Notes (Signed)
Pt arrives to ED c/o edema to left legs, right arm and abdomin x 1 day. Pt with hx of edema, states that it is hereditary and she usually receives Berinert for symptoms. Pt denies angio edema today.

## 2023-08-13 ENCOUNTER — Encounter: Payer: Self-pay | Admitting: Allergy

## 2023-08-13 MED ORDER — SODIUM CHLORIDE 0.9 % IV SOLN: INTRAVENOUS | Status: DC

## 2023-08-13 MED ORDER — C1 ESTERASE INHIBITOR (HUMAN) 500 UNITS IV KIT
20.0000 [IU]/kg | PACK | Freq: Once | INTRAVENOUS | Status: AC
  Administered 2023-08-13: 1500 [IU] via INTRAVENOUS
  Filled 2023-08-13: qty 1500

## 2023-08-13 NOTE — Discharge Instructions (Addendum)
Continue your usual medications as prescribed. Please return here if any new/acute changes.

## 2023-08-13 NOTE — ED Provider Notes (Signed)
Center EMERGENCY DEPARTMENT AT [facility] Provider Note   CSN: [id number] Arrival date & time: 08/12/23  2126     History  Chief Complaint  Patient presents with   Leg Swelling   Arm Swelling    Jasmine Le is a 47 y.o. female.  The history is provided by the patient and medical records.   47 year old female with history of HAE, presenting to the ED for acute flare.  Patient states last week she was sick with a sinus infection and was taking some over-the-counter medications for a few days.  She has also been under a lot of stress with her family and job so suspect this is what caused her symptoms.  She began having swelling of the left leg yesterday, today is progressed to her right arm and her abdomen which is typical for her.  She does not have any swelling of the face or tongue, no difficulty swallowing, no chest pain or shortness of breath.  States her husband is out of town so she was quite nervous when her symptoms began.  She does take daily orladeyo and symptoms have been reasonably well-controlled.  She has home prescription for firazyr to take during acute flare but is never taken this before and since she was home alone she was concerned.  In the hospital she usually receives Berinert which works well.  Home Medications Prior to Admission medications   Medication Sig Start Date End Date Taking? Authorizing Provider  amLODipine (NORVASC) 5 MG tablet Take 5 mg by mouth in the morning. 09/08/20   [provider]  fexofenadine (ALLEGRA) 180 MG tablet Take 180 mg by mouth at bedtime.    [provider]  icatibant Acetate (FIRAZYR) 30 MG/3ML injection Inject 3 mLs (30 mg total) into the skin as needed. 09/29/22   Ellamae Sia, DO  levonorgestrel (MIRENA) 20 MCG/24HR IUD 1 each by Intrauterine route once.     [provider]  Multiple Vitamins-Minerals (EMERGEN-C VITAMIN C) CHEW Chew 3 tablets by mouth daily with lunch.    [provider]  ORLADEYO 150 MG CAPS Take 150 capsules by mouth daily with supper. 12/06/21   [provider]      Allergies    Thiethylperazine, Ace inhibitors, Advil [ibuprofen], and E-mycin [erythromycin]    Review of Systems   Review of Systems  Cardiovascular:  Positive for leg swelling.  All other systems reviewed and are negative.   Physical Exam Updated Vital Signs BP (!) 160/98 (BP Location: Right Arm)   Pulse (!) 117   Temp 98.3 F (36.8 C)   Resp 16   Ht 5\' 5"  (1.651 m)   Wt 64 kg   SpO2 98%   BMI 23.46 kg/m  Physical Exam Vitals and nursing note reviewed.  Constitutional:      Appearance: She is well-developed.  HENT:     Head: Normocephalic and atraumatic.     Mouth/Throat:     Comments: No lip or tongue swelling, handling secretions well, no stridor Eyes:     Conjunctiva/sclera: Conjunctivae normal.     Pupils: Pupils are equal, round, and reactive to light.  Cardiovascular:     Rate and Rhythm: Normal rate and regular rhythm.     Heart sounds: Normal heart sounds.  Pulmonary:     Effort: Pulmonary effort is normal.     Breath sounds: Normal breath sounds. No wheezing or rhonchi.     Comments: Lung sounds are clear, no distress  Abdominal:     General: Bowel sounds are normal.     Palpations: Abdomen is soft.  Musculoskeletal:        General: Normal range of motion.     Cervical back: Normal range of motion.     Comments: Swelling noted of left lower leg, particularly around the ankle, some mild swelling of right arm, no erythema or induration, no findings of acute cellulitis  Skin:    General: Skin is warm and dry.  Neurological:     Mental Status: She is alert and oriented to person, place, and time.     ED Results / Procedures / Treatments   Labs (all labs ordered are listed, but only abnormal results are displayed) Labs Reviewed  CBC - Abnormal; Notable for the following components:      Result Value   WBC 18.5 (*)    All other  components within normal limits  BASIC METABOLIC PANEL - Abnormal; Notable for the following components:   Glucose, Bld 129 (*)    All other components within normal limits  BRAIN NATRIURETIC PEPTIDE    EKG None  Radiology No results found.  Procedures Procedures    CRITICAL CARE Performed by: Garlon Hatchet   Total critical care time: 35 minutes  Critical care time was exclusive of separately billable procedures and treating other patients.  Critical care was necessary to treat or prevent imminent or life-threatening deterioration.  Critical care was time spent personally by me on the following activities: development of treatment plan with patient and/or surrogate as well as nursing, discussions with consultants, evaluation of patient's response to treatment, examination of patient, obtaining history from patient or surrogate, ordering and performing treatments and interventions, ordering and review of laboratory studies, ordering and review of radiographic studies, pulse oximetry and re-evaluation of patient's condition.   Medications Ordered in ED Medications  0.9 %  sodium chloride infusion (0 mLs Intravenous Stopped 08/13/23 0331)  C1 esterase inhibitor (Human) (BERINERT) injection 1,500 Units (1,500 Units Intravenous Given 08/13/23 0130)    ED Course/ Medical Decision Making/ A&P                                 Medical Decision Making Amount and/or Complexity of Data Reviewed Labs: ordered. ECG/medicine tests: ordered and independent interpretation performed.  Risk Prescription drug management.   47 year old female presenting to the ED with swelling of left leg, right arm, and somewhat of the abdomen.  She has hereditary angioedema, symptoms began yesterday.  Suspect likely combination of recent sinus infection and family/job stressors.  She does have home firazyr to use when flares, however was home alone and has never used this so preferred to come to the ED.  she does have some swelling on exam, mostly of the extremities.  She does not have any facial or neck swelling.  No tongue or lip swelling, handling secretions well.  She tends to do well with Berinert injections, this will be given.  Will monitor.  3:20 AM Feeling better after infusions, swelling improved.  VSS.  Remains without airway compromise.  Feel she is stable for discharge.  Can follow-up with PCP/allergist.  Return here for new concerns.  Final Clinical Impression(s) / ED Diagnoses Final diagnoses:  Hereditary angio-edema [facility])    Rx / DC Orders ED Discharge Orders     None         Garlon Hatchet, PA-C 08/13/23  1610    Cy Blamer, MD 08/13/23 [phone]

## 2023-09-02 ENCOUNTER — Other Ambulatory Visit: Payer: Self-pay | Admitting: Internal Medicine

## 2023-09-02 DIAGNOSIS — Z1231 Encounter for screening mammogram for malignant neoplasm of breast: Secondary | ICD-10-CM

## 2023-09-29 NOTE — Progress Notes (Unsigned)
Follow Up Note  RE: Jasmine Le MRN: [id number] DOB: November 17, 1976 Date of Office Visit: 09/30/2023  Referring provider: Melida Quitter, MD Primary care provider: Melida Quitter, MD  Chief Complaint: No chief complaint on file.  History of Present Illness: I had the pleasure of seeing Jasmine Le for a follow up visit at the [facility] [place] on 09/29/2023. She is a 47 y.o. female, who is being followed for HAE on orladeyo. Her previous allergy office visit was on 03/30/2023 with Dr. Selena Batten. Today is a regular follow up visit.  Discussed the use of AI scribe software for clinical note transcription with the patient, who gave verbal consent to proceed.  History of Present Illness            Hereditary angioedema Past history - Diagnosed with HAE in her 57s in [place] after having frequent bouts of angioedema of her face, feet, hands, abdominal area. One episode of laryngeal swelling requiring ICU stay without intubation. Since her initial diagnosis, severe attacks have diminished and last ER visit was in 2019 requiring Berinert. However, she does have milder angioedema of hands and feet throughout the year. No previous prophylactic medications and does not have acute medicine on hand either. Previously she had prophylactic IV medications prior to an oral surgery 5-10 years ago and did well. No other family members with HAE. Had colonoscopy, dental cleaning, filling and mammogram with no attacks.  Interim history - no swelling episodes since on Orladeyo. No procedures scheduled.  Please keep track of your attacks even mild ones. Continue Orladeyo - 1 pill orally daily for preventative measures. If it doesn't work as well there are many other options these days. Use Firazyr for acute attacks and go to ER after using the medication.  Make sure you take it to any procedural appointments you have - dental, mammogram, pap smears, colonoscopy. Recommend to continue to get  colonoscopy in a hospital setting and NOT at an outpatient surgical center in case you have a large reaction.   Thanks for the update. Did the swelling all subside?   Let me know if you have any other episodes.   There are some other medications we can try if you want to switch from [place].   There's one called Jasmine Le that has very good data but it is an injection that is initially given every 2 weeks and if attack free for 6 months then can go to every 4 weeks.   08/12/2023 ER visit: "47 year old female presenting to the ED with swelling of left leg, right arm, and somewhat of the abdomen.  She has hereditary angioedema, symptoms began yesterday.  Suspect likely combination of recent sinus infection and family/job stressors.  She does have home firazyr to use when flares, however was home alone and has never used this so preferred to come to the ED. she does have some swelling on exam, mostly of the extremities.  She does not have any facial or neck swelling.  No tongue or lip swelling, handling secretions well.  She tends to do well with Berinert injections, this will be given.  Will monitor.   3:20 AM Feeling better after infusions, swelling improved.  VSS.  Remains without airway compromise.  Feel she is stable for discharge.  Can follow-up with PCP/allergist.  Return here for new concerns"  Assessment and Plan: Jasmine Le is a 47 y.o. female with: *** Assessment and Plan  No follow-ups on file.  No orders of the defined types were placed in this encounter.  Lab Orders  No laboratory test(s) ordered today    Diagnostics: Spirometry:  Tracings reviewed. Her effort: {Blank single:19197::"Good reproducible efforts.","It was hard to get consistent efforts and there is a question as to whether this reflects a maximal maneuver.","Poor effort, data can not be interpreted."} FVC: ***L FEV1: ***L, ***% predicted FEV1/FVC ratio: ***% Interpretation: {Blank  single:19197::"Spirometry consistent with mild obstructive disease","Spirometry consistent with moderate obstructive disease","Spirometry consistent with severe obstructive disease","Spirometry consistent with possible restrictive disease","Spirometry consistent with mixed obstructive and restrictive disease","Spirometry uninterpretable due to technique","Spirometry consistent with normal pattern","No overt abnormalities noted given today's efforts"}.  Please see scanned spirometry results for details.  Skin Testing: {Blank single:19197::"Select foods","Environmental allergy panel","Environmental allergy panel and select foods","Food allergy panel","None","Deferred due to recent antihistamines use"}. *** Results discussed with patient/family.   Medication List:  Current Outpatient Medications  Medication Sig Dispense Refill   amLODipine (NORVASC) 5 MG tablet Take 5 mg by mouth in the morning.     fexofenadine (ALLEGRA) 180 MG tablet Take 180 mg by mouth at bedtime.     icatibant Acetate (FIRAZYR) 30 MG/3ML injection Inject 3 mLs (30 mg total) into the skin as needed. 9 mL 3   levonorgestrel (MIRENA) 20 MCG/24HR IUD 1 each by Intrauterine route once.      Multiple Vitamins-Minerals (EMERGEN-C VITAMIN C) CHEW Chew 3 tablets by mouth daily with lunch.     ORLADEYO 150 MG CAPS Take 150 capsules by mouth daily with supper.     No current facility-administered medications for this visit.   Allergies: Allergies  Allergen Reactions   Thiethylperazine Anaphylaxis   Ace Inhibitors Swelling   Advil [Ibuprofen] Nausea And Vomiting    In high doses   E-Mycin [Erythromycin] Nausea And Vomiting   I reviewed her past medical history, social history, family history, and environmental history and no significant changes have been reported from her previous visit.  Review of Systems  Constitutional:  Negative for appetite change, chills, fever and unexpected weight change.  HENT:  Negative for  congestion and rhinorrhea.   Eyes:  Negative for itching.  Respiratory:  Negative for cough, chest tightness, shortness of breath and wheezing.   Cardiovascular:  Negative for chest pain.  Gastrointestinal:  Negative for abdominal pain.  Genitourinary:  Negative for difficulty urinating.  Skin:  Negative for rash.  Allergic/Immunologic: Negative for food allergies.  Neurological:  Negative for headaches.    Objective: There were no vitals taken for this visit. There is no height or weight on file to calculate BMI. Physical Exam Vitals and nursing note reviewed.  Constitutional:      Appearance: She is well-developed.  HENT:     Head: Normocephalic and atraumatic.     Right Ear: Tympanic membrane and external ear normal.     Left Ear: Tympanic membrane and external ear normal.     Nose: Nose normal.  Eyes:     Conjunctiva/sclera: Conjunctivae normal.  Cardiovascular:     Rate and Rhythm: Normal rate and regular rhythm.     Heart sounds: Normal heart sounds. No murmur heard.    No friction rub. No gallop.  Pulmonary:     Effort: Pulmonary effort is normal.     Breath sounds: Normal breath sounds. No wheezing or rales.  Musculoskeletal:     Cervical back: Neck supple.  Skin:    General: Skin is warm.     Findings: No  rash.  Neurological:     Mental Status: She is alert and oriented to person, place, and time.  Psychiatric:        Behavior: Behavior normal.    Previous notes and tests were reviewed. The plan was reviewed with the patient/family, and all questions/concerned were addressed.  It was my pleasure to see Jasmine Le today and participate in her care. Please feel free to contact me with any questions or concerns.  Sincerely,  Wyline Mood, DO Allergy & Immunology  [facility] office: [phone] [facility] office: [phone]

## 2023-09-30 ENCOUNTER — Ambulatory Visit (INDEPENDENT_AMBULATORY_CARE_PROVIDER_SITE_OTHER): Payer: BC Managed Care – PPO | Admitting: Allergy

## 2023-09-30 ENCOUNTER — Encounter: Payer: Self-pay | Admitting: Allergy

## 2023-09-30 ENCOUNTER — Other Ambulatory Visit: Payer: Self-pay

## 2023-09-30 ENCOUNTER — Ambulatory Visit
Admission: RE | Admit: 2023-09-30 | Discharge: 2023-09-30 | Disposition: A | Discharge status: Home / Self Care | Payer: BC Managed Care – PPO | Source: Ambulatory Visit | Attending: Internal Medicine

## 2023-09-30 VITALS — BP 120/76 | HR 91 | Temp 98.1°F | Resp 18 | Ht 64.57 in | Wt 138.4 lb

## 2023-09-30 DIAGNOSIS — D841 Defects in the complement system: Secondary | ICD-10-CM | POA: Diagnosis not present

## 2023-09-30 DIAGNOSIS — Z1231 Encounter for screening mammogram for malignant neoplasm of breast: Secondary | ICD-10-CM

## 2023-09-30 NOTE — Patient Instructions (Addendum)
Hereditary angioedema  Please keep track of your attacks even mild ones. Continue Orladeyo - 1 pill orally daily for preventative measures. If it doesn't work as well there are many other options these days.  Use Firazyr for acute attacks and go to ER after using the medication.  Make sure you take it to any procedural appointments you have - dental, mammogram, pap smears, colonoscopy. Recommend to continue to get colonoscopy in a hospital setting and NOT at an outpatient surgical center in case you have a large reaction.  Follow up in 6 months or sooner if needed.

## 2024-02-29 ENCOUNTER — Telehealth: Payer: Self-pay | Admitting: Allergy

## 2024-02-29 NOTE — Telephone Encounter (Signed)
 Called to schedule [facility] reapproval appointment. Could not leave voicemail.

## 2024-03-09 ENCOUNTER — Encounter: Payer: Self-pay | Admitting: Allergy

## 2024-03-09 NOTE — Telephone Encounter (Signed)
 Tammy, can you please refill firazyr  for this patient?  Thank you.

## 2024-03-15 ENCOUNTER — Other Ambulatory Visit: Payer: Self-pay | Admitting: *Deleted

## 2024-03-15 MED ORDER — ICATIBANT ACETATE 30 MG/3ML ~~LOC~~ SOSY
30.0000 mg | PREFILLED_SYRINGE | SUBCUTANEOUS | 3 refills | Status: AC | PRN
Start: 2024-03-15 — End: ?

## 2024-03-28 ENCOUNTER — Ambulatory Visit: Payer: BC Managed Care – PPO | Admitting: Allergy

## 2024-04-24 NOTE — Progress Notes (Unsigned)
 Follow Up Note  RE: Jasmine Le MRN: [id number] DOB: [birth year] Date of Office Visit: 04/25/2024  Referring provider: Azalia Leo, MD Primary care provider: Azalia Leo, MD  Chief Complaint: No chief complaint on file.  History of Present Illness: I had the pleasure of seeing Jasmine Le for a follow up visit at the [facility] on 04/24/2024. She is a 48 y.o. female, who is being followed for hereditary angioedema. Her previous allergy office visit was on 09/30/2023 with Dr. Burdette Carolin. Today is a regular follow up visit.  Discussed the use of AI scribe software for clinical note transcription with the patient, who gave verbal consent to proceed.  History of Present Illness            ***  Assessment and Plan: Jasmine Le is a 48 y.o. female with: Hereditary angioedema Past history - Diagnosed with HAE in her 3s in [place]  after having frequent bouts of angioedema of her face, feet, hands, abdominal area. One episode of laryngeal swelling requiring ICU stay without intubation. Since her initial diagnosis, severe attacks have diminished and last ER visit was in 2019 requiring Berinert . However, she does have milder angioedema of hands and feet throughout the year. No previous prophylactic medications and does not have acute medicine on hand either. Previously she had prophylactic IV medications prior to an oral surgery 5-10 years ago and did well. No other family members with HAE. Had colonoscopy, dental cleaning, filling and mammogram with no attacks.  Interim history - HAE episode in Sept triggered by stress and possible cold medication use. Episode started in foot and progressed to stomach, but resolved with Berinert  in the emergency department. Patient is on Orladeyo daily and has been generally well controlled with only one episode in the past several years. Please keep track of your attacks even mild ones. Continue Orladeyo - 1 pill orally daily for preventative  measures. If it doesn't work as well there are many other options these days. Recommend [place]ro next - injection every 2 weeks then every 4 weeks if attack free for 6 months. Patient prefer oral medication for now.  Use Firazyr  for acute attacks and go to ER after using the medication.  Make sure you take it to any procedural appointments you have - dental, mammogram, pap smears, colonoscopy. Recommend to continue to get colonoscopy in a hospital setting and NOT at an outpatient surgical center in case you have a large reaction. Assessment and Plan              No follow-ups on file.  No orders of the defined types were placed in this encounter.  Lab Orders  No laboratory test(s) ordered today    Diagnostics: Spirometry:  Tracings reviewed. Her effort: {Blank single:19197::"Good reproducible efforts.","It was hard to get consistent efforts and there is a question as to whether this reflects a maximal maneuver.","Poor effort, data can not be interpreted."} FVC: ***L FEV1: ***L, ***% predicted FEV1/FVC ratio: ***% Interpretation: {Blank single:19197::"Spirometry consistent with mild obstructive disease","Spirometry consistent with moderate obstructive disease","Spirometry consistent with severe obstructive disease","Spirometry consistent with possible restrictive disease","Spirometry consistent with mixed obstructive and restrictive disease","Spirometry uninterpretable due to technique","Spirometry consistent with normal pattern","No overt abnormalities noted given today's efforts"}.  Please see scanned spirometry results for details.  Skin Testing: {Blank single:19197::"Select foods","Environmental allergy panel","Environmental allergy panel and select foods","Food allergy panel","None","Deferred due to recent antihistamines use"}. *** Results discussed with patient/family.   Medication List:  Current Outpatient Medications  Medication  Sig Dispense Refill  . amLODipine  (NORVASC) 5 MG tablet Take 5 mg by mouth in the morning.    . fexofenadine (ALLEGRA) 180 MG tablet Take 180 mg by mouth at bedtime.    . icatibant  Acetate (FIRAZYR ) 30 MG/3ML injection Inject 3 mLs (30 mg total) into the skin as needed. 9 mL 3  . levonorgestrel (MIRENA) 20 MCG/24HR IUD 1 each by Intrauterine route once.     . Multiple Vitamins-Minerals (EMERGEN-C VITAMIN C) CHEW Chew 3 tablets by mouth daily with lunch.    . ORLADEYO 150 MG CAPS Take 150 capsules by mouth daily with supper.     No current facility-administered medications for this visit.   Allergies: Allergies  Allergen Reactions  . Thiethylperazine Anaphylaxis  . Ace Inhibitors Swelling  . Advil [Ibuprofen] Nausea And Vomiting    In high doses  . E-Mycin [Erythromycin] Nausea And Vomiting   I reviewed her past medical history, social history, family history, and environmental history and no significant changes have been reported from her previous visit.  Review of Systems  Constitutional:  Negative for appetite change, chills, fever and unexpected weight change.  HENT:  Negative for congestion and rhinorrhea.   Eyes:  Negative for itching.  Respiratory:  Negative for cough, chest tightness, shortness of breath and wheezing.   Cardiovascular:  Negative for chest pain.  Gastrointestinal:  Negative for abdominal pain.  Genitourinary:  Negative for difficulty urinating.  Skin:  Negative for rash.  Allergic/Immunologic: Negative for food allergies.  Neurological:  Negative for headaches.   Objective: There were no vitals taken for this visit. There is no height or weight on file to calculate BMI. Physical Exam Vitals and nursing note reviewed.  Constitutional:      Appearance: She is well-developed.  HENT:     Head: Normocephalic and atraumatic.     Right Ear: Tympanic membrane and external ear normal.     Left Ear: Tympanic membrane and external ear normal.     Nose: Nose normal.  Eyes:      Conjunctiva/sclera: Conjunctivae normal.  Cardiovascular:     Rate and Rhythm: Normal rate and regular rhythm.     Heart sounds: Normal heart sounds. No murmur heard.    No friction rub. No gallop.  Pulmonary:     Effort: Pulmonary effort is normal.     Breath sounds: Normal breath sounds. No wheezing or rales.  Musculoskeletal:     Cervical back: Neck supple.  Skin:    General: Skin is warm.     Findings: No rash.  Neurological:     Mental Status: She is alert and oriented to person, place, and time.  Psychiatric:        Behavior: Behavior normal.  Previous notes and tests were reviewed. The plan was reviewed with the patient/family, and all questions/concerned were addressed.  It was my pleasure to see Jasmine Le today and participate in her care. Please feel free to contact me with any questions or concerns.  Sincerely,  Eudelia Hero, DO Allergy & Immunology  [facility] [place]  [place] office: [phone] [facility] office: [phone]

## 2024-04-25 ENCOUNTER — Encounter: Payer: Self-pay | Admitting: Allergy

## 2024-04-25 ENCOUNTER — Other Ambulatory Visit: Payer: Self-pay

## 2024-04-25 ENCOUNTER — Ambulatory Visit (INDEPENDENT_AMBULATORY_CARE_PROVIDER_SITE_OTHER): Admitting: Allergy

## 2024-04-25 VITALS — BP 122/70 | HR 82 | Temp 98.3°F | Resp 18 | Ht 65.0 in | Wt 144.1 lb

## 2024-04-25 DIAGNOSIS — D841 Defects in the complement system: Secondary | ICD-10-CM

## 2024-04-25 NOTE — Patient Instructions (Addendum)
 Congratulations on quitting smoking!  Hereditary angioedema  Please keep track of your attacks even mild ones. Continue Orladeyo - 1 pill orally daily for preventative measures. If it doesn't work as well there are many other options these days.  Use Firazyr  for acute attacks and go to ER after using the medication.  If it's not covered let me know. Tammy will be in touch with you.  Make sure you take it to any procedural appointments you have - dental, mammogram, pap smears, colonoscopy. Recommend to continue to get colonoscopy in a hospital setting and NOT at an outpatient surgical center in case you have a large reaction.  Follow up in 6 months or sooner if needed.

## 2024-05-04 ENCOUNTER — Telehealth: Payer: Self-pay | Admitting: *Deleted

## 2024-05-04 MED ORDER — ICATIBANT ACETATE 30 MG/3ML ~~LOC~~ SOSY: 30.0000 mg | PREFILLED_SYRINGE | SUBCUTANEOUS | 3 refills | Status: AC | PRN

## 2024-05-04 NOTE — Telephone Encounter (Signed)
-----   Message from Trudy Fusi sent at 04/25/2024 12:57 PM EDT ----- Benn Brash, patient needs Firazyr . She was unable to get last time due to cost. She can get the generic branded if that's cheaper. Thank you.

## 2024-05-04 NOTE — Telephone Encounter (Signed)
 L/m for patient approval and new rx to [facility] for generic firazyr  sent

## 2024-05-12 ENCOUNTER — Emergency Department (HOSPITAL_COMMUNITY)
Admission: EM | Admit: 2024-05-12 | Discharge: 2024-05-13 | Disposition: A | Discharge status: Home / Self Care | Attending: Emergency Medicine | Admitting: Emergency Medicine

## 2024-05-12 ENCOUNTER — Encounter (HOSPITAL_COMMUNITY): Payer: Self-pay

## 2024-05-12 ENCOUNTER — Encounter: Payer: Self-pay | Admitting: Allergy

## 2024-05-12 ENCOUNTER — Other Ambulatory Visit: Payer: Self-pay

## 2024-05-12 DIAGNOSIS — R220 Localized swelling, mass and lump, head: Secondary | ICD-10-CM | POA: Diagnosis present

## 2024-05-12 DIAGNOSIS — T783XXA Angioneurotic edema, initial encounter: Secondary | ICD-10-CM | POA: Diagnosis not present

## 2024-05-12 MED ORDER — C1 ESTERASE INHIBITOR (HUMAN) 500 UNITS IV KIT
20.0000 [IU]/kg | PACK | Freq: Once | INTRAVENOUS | Status: AC
Start: 2024-05-12 — End: 2024-05-12
  Administered 2024-05-12: 1500 [IU] via INTRAVENOUS
  Filled 2024-05-12: qty 1500

## 2024-05-12 NOTE — Discharge Instructions (Signed)
 If you develop new or worsening swelling, trouble breathing, trouble swallowing, or any other new/concerning symptoms then return to the ER.

## 2024-05-12 NOTE — ED Provider Notes (Signed)
 [place] EMERGENCY DEPARTMENT AT [facility] Provider Note   CSN: [id number] Arrival date & time: 05/12/24  1120     Patient presents with: Angioedema   Jasmine Le is a 48 y.o. female.   HPI 48 year old female with a history of hereditary angioedema presents with facial swelling.  She had dental filling yesterday and then noticed a slight bit of swelling last night.  This morning she woke up and has had progressively worsening swelling to her upper lip.  She feels like her throat is just a little bit scratchy.  She previously has needed Berinert  to treat her symptoms.  No current dyspnea or rash.  Prior to Admission medications   Medication Sig Start Date End Date Taking? Authorizing Provider  amLODipine (NORVASC) 5 MG tablet Take 5 mg by mouth in the morning. 09/08/20   [provider]  fexofenadine (ALLEGRA) 180 MG tablet Take 180 mg by mouth at bedtime.    [provider]  icatibant  Acetate (FIRAZYR ) 30 MG/3ML injection Inject 3 mLs (30 mg total) into the skin as needed. Dispense generic 05/04/24   Jeneal Danita Macintosh, MD  levonorgestrel (MIRENA) 20 MCG/24HR IUD 1 each by Intrauterine route once.     [provider]  Multiple Vitamins-Minerals (EMERGEN-C VITAMIN C) CHEW Chew 3 tablets by mouth daily with lunch.    [provider]  ORLADEYO 150 MG CAPS Take 150 capsules by mouth daily with supper. 12/06/21   [provider]    Allergies: Thiethylperazine, Ace inhibitors, Advil [ibuprofen], E-mycin [erythromycin], and Other    Review of Systems  HENT:  Positive for facial swelling. Negative for sore throat and voice change.   Respiratory:  Negative for shortness of breath.     Updated Vital Signs BP (!) 141/96 (BP Location: Right Arm)   Pulse (!) 116   Temp 98.7 F (37.1 C)   Resp 16   Ht 5' 5 (1.651 m)   Wt 63.5 kg   SpO2 99%   BMI 23.30 kg/m   Physical Exam Vitals and nursing note reviewed.   Constitutional:      Appearance: She is well-developed.  HENT:     Head: Normocephalic and atraumatic.      Mouth/Throat:     Comments: No intra-oral swelling or lesions  Cardiovascular:     Rate and Rhythm: Normal rate and regular rhythm.     Heart sounds: Normal heart sounds.  Pulmonary:     Effort: Pulmonary effort is normal.     Breath sounds: Normal breath sounds.  Abdominal:     Palpations: Abdomen is soft.     Tenderness: There is no abdominal tenderness.   Skin:    General: Skin is warm and dry.   Neurological:     Mental Status: She is alert.     (all labs ordered are listed, but only abnormal results are displayed) Labs Reviewed - No data to display  EKG: None  Radiology: No results found.   .Critical Care  Performed by: Freddi Hamilton, MD Authorized by: Freddi Hamilton, MD   Critical care provider statement:    Critical care time (minutes):  30   Critical care time was exclusive of:  Separately billable procedures and treating other patients   Critical care was necessary to treat or prevent imminent or life-threatening deterioration of the following conditions:  Respiratory failure   Critical care was time spent personally by me on the following activities:  Development of treatment plan with patient or  surrogate, discussions with consultants, evaluation of patient's response to treatment, examination of patient, ordering and review of laboratory studies, ordering and review of radiographic studies, ordering and performing treatments and interventions, pulse oximetry, re-evaluation of patient's condition and review of old charts    Medications Ordered in the ED  C1 esterase inhibitor (Human) (BERINERT ) injection 1,500 Units (has no administration in time range)                                    Medical Decision Making Risk Prescription drug management.   Patient presents with recurrent angioedema.  No distress but she is feeling a little  tickle in her throat and with the facial swelling we will go ahead and treat with Berinert .  She has had some mild improvement and feels well enough for discharge after a couple hours of observation.  Will discharge home with return precautions and recommend following up with her allergist.     Final diagnoses:  None    ED Discharge Orders     None          Freddi Hamilton, MD 05/12/24 [phone]

## 2024-05-12 NOTE — ED Notes (Signed)
 CCM called for tele admit.

## 2024-05-12 NOTE — ED Triage Notes (Signed)
 Pt states she has HAE hereditary angioedema and states she got a tooth filling yesterday and the dentist was too rough on lips. Pt woke up with angioedema. Axox4. No trouble swallowing/ talking. Pt states she needs a medication called Berinert .

## 2024-05-16 NOTE — Telephone Encounter (Signed)
 Tammy,  Can you see if her co-pay for Sajazir  (which is the branded generic for firazyr ) would be more cost effective for her?  I remember the rep saying that they have some type of patient assistance programs.    [email or web address] .[email or web address]/hcp/getting-[email or web address]  Thank you.

## 2024-06-14 ENCOUNTER — Encounter: Payer: Self-pay | Admitting: Allergy

## 2024-10-26 ENCOUNTER — Other Ambulatory Visit: Payer: Self-pay

## 2024-10-26 ENCOUNTER — Ambulatory Visit: Admitting: Allergy

## 2024-10-26 ENCOUNTER — Encounter: Payer: Self-pay | Admitting: Allergy

## 2024-10-26 VITALS — BP 116/80 | HR 78 | Temp 98.6°F | Ht 64.5 in | Wt 130.2 lb

## 2024-10-26 DIAGNOSIS — D841 Defects in the complement system: Secondary | ICD-10-CM

## 2024-10-26 NOTE — Progress Notes (Signed)
 Follow Up Note  RE: Jasmine Le MRN: [id number] DOB: 1976/07/03 Date of Office Visit: 10/26/2024  Referring provider: Stephane Leita DEL, MD Primary care provider: Stephane Leita DEL, MD  Chief Complaint: Angioedema (No issues.)  History of Present Illness: I had the pleasure of seeing Jasmine Le for a follow up visit at the [facility] on 10/26/2024. She is a 48 y.o. female, who is being followed for HAE. Her previous allergy office visit was on 04/25/2024 with Dr. Luke. Today is a regular follow up visit.  Discussed the use of AI scribe software for clinical note transcription with the patient, who gave verbal consent to proceed.    Previously, she had an episode after a dental procedure, which required hospital intervention due to a lack of acute medication at home at the time. She now has a supply of the generic Firazyr , which she has not needed to use recently.  Her current medication regimen includes Orladeyo, taken as one pill once daily. She has experienced no minor attacks. She is hesitant about self-administering injections but has a nurse friend who can assist if needed.   She is the only one in her family with hereditary angioedema, as her parents and siblings have tested negative. Her grandparents have passed.     Assessment and Plan: Jasmine Le is a 48 y.o. female with: Hereditary angioedema Past history - Diagnosed with HAE in her 12s in [place]  after having frequent bouts of angioedema of her face, feet, hands, abdominal area. One episode of laryngeal swelling requiring ICU stay without intubation. Since her initial diagnosis, severe attacks have diminished and last ER visit was in 2019 requiring Berinert . However, she does have milder angioedema of hands and feet throughout the year. No previous prophylactic medications and does not have acute medicine on hand either. Previously she had prophylactic IV medications prior to an oral surgery 5-10 years ago and did  well. No other family members with HAE. Had colonoscopy, dental cleaning, filling and mammogram with no attacks. Last episode in Sept 2024. Interim history - stable since last episode in June 2026 that required ER visit with IV Berinert . Most likely triggered by dental work and had no Firazyr  at home.  Please keep track of your attacks even mild ones. Continue Orladeyo - 1 pill orally daily for preventative measures. If it doesn't work as well there are many other options these days. Consider starting Takzhyro 300mg  every 2 weeks initially and if no symptoms in 6 months then we can go to every 4 weeks.  Use generic Firazyr  for acute attacks and go to ER after using the medication.  Make sure you take it to any procedural appointments you have - dental, mammogram, pap smears, colonoscopy. If Firazyr  ineffective, then may need IV Berinert  infusion to treat symptoms.  Recommend to continue to get colonoscopy in a hospital setting and NOT at an outpatient surgical center in case you have a large reaction.  Sending in prescription for the [facility] - which is an oral acute rescue medication. Take 2 pills at a time. May take a second dose after 3 hours. No more than 4 pills in 24 hours.  Return in about 6 months (around 04/26/2025).  No orders of the defined types were placed in this encounter.  Lab Orders  No laboratory test(s) ordered today    Diagnostics: None.   Medication List:  Current Outpatient Medications  Medication Sig Dispense Refill   amLODipine (NORVASC) 5 MG tablet Take  5 mg by mouth daily.     Bioflavonoid Products (VITAMIN C) CHEW Chew 3 each by mouth daily.     Fexofenadine HCl (ALLEGRA PO) Take 1 tablet by mouth daily with supper.     icatibant  Acetate (FIRAZYR ) 30 MG/3ML injection Inject 3 mLs (30 mg total) into the skin as needed. Dispense generic (Patient not taking: Reported on 05/12/2024) 9 mL 3   levonorgestrel (MIRENA) 20 MCG/24HR IUD 1 each by Intrauterine route  once.      ORLADEYO 150 MG CAPS Take 150 capsules by mouth daily with supper.     No current facility-administered medications for this visit.   Allergies: Allergies  Allergen Reactions   Torecan [Thiethylperazine] Anaphylaxis   Ace Inhibitors Swelling   Advil [Ibuprofen] Nausea And Vomiting    Reaction occurs with high doses   E-Mycin [Erythromycin] Nausea And Vomiting   I reviewed her past medical history, social history, family history, and environmental history and no significant changes have been reported from her previous visit.  Review of Systems  Constitutional:  Negative for appetite change, chills, fever and unexpected weight change.  HENT:  Negative for congestion and rhinorrhea.   Eyes:  Negative for itching.  Respiratory:  Negative for cough, chest tightness, shortness of breath and wheezing.   Cardiovascular:  Negative for chest pain.  Gastrointestinal:  Negative for abdominal pain.  Genitourinary:  Negative for difficulty urinating.  Skin:  Negative for rash.  Allergic/Immunologic: Negative for food allergies.  Neurological:  Negative for headaches.    Objective: BP 116/80 (BP Location: Right Arm, Patient Position: Sitting, Cuff Size: Normal)   Pulse 78   Temp 98.6 F (37 C) (Temporal)   Ht 5' 4.5 (1.638 m)   Wt 130 lb 3.2 oz (59.1 kg)   SpO2 99%   BMI 22.00 kg/m  Body mass index is 22 kg/m. Physical Exam Vitals and nursing note reviewed.  Constitutional:      Appearance: She is well-developed.  HENT:     Head: Normocephalic and atraumatic.     Right Ear: Tympanic membrane and external ear normal.     Left Ear: Tympanic membrane and external ear normal.     Nose: Nose normal.  Eyes:     Conjunctiva/sclera: Conjunctivae normal.  Cardiovascular:     Rate and Rhythm: Normal rate and regular rhythm.     Heart sounds: Normal heart sounds. No murmur heard.    No friction rub. No gallop.  Pulmonary:     Effort: Pulmonary effort is normal.     Breath  sounds: Normal breath sounds. No wheezing or rales.  Musculoskeletal:     Cervical back: Neck supple.  Skin:    General: Skin is warm.     Findings: No rash.  Neurological:     Mental Status: She is alert and oriented to person, place, and time.  Psychiatric:        Behavior: Behavior normal.    Previous notes and tests were reviewed. The plan was reviewed with the patient/family, and all questions/concerned were addressed.  It was my pleasure to see Jasmine Le today and participate in her care. Please feel free to contact me with any questions or concerns.  Sincerely,  Orlan Cramp, DO Allergy & Immunology  [facility]  [facility] office: [phone] [facility] office: [phone]

## 2024-10-26 NOTE — Patient Instructions (Addendum)
 Hereditary angioedema  Please keep track of your attacks even mild ones. Continue Orladeyo - 1 pill orally daily for preventative measures. If it doesn't work as well there are many other options these days. Consider starting Takzhyro 300mg  every 2 weeks initially and if no symptoms in 6 months then we can go to every 4 weeks.   Use generic Firazyr  for acute attacks and go to ER after using the medication.  If it's not covered let me know. Make sure you take it to any procedural appointments you have - dental, mammogram, pap smears, colonoscopy. Recommend to continue to get colonoscopy in a hospital setting and NOT at an outpatient surgical center in case you have a large reaction.  I'm going to send in prescription for the [facility] - which is an oral acute rescue medication. Take 2 pills at a time. May take a second dose after 3 hours. No more than 4 pills in 24 hours.  Follow up in 6 months or sooner if needed.

## 2024-11-06 ENCOUNTER — Telehealth: Payer: Self-pay | Admitting: *Deleted

## 2024-11-06 NOTE — Telephone Encounter (Signed)
-----   Message from Orlan Cramp, DO sent at 10/26/2024  2:45 PM EST ----- Leroy Pettway, can you do PA for [facility] for acute HAE attacks? Patient is a little needlephobic. Thank you.

## 2024-11-06 NOTE — Telephone Encounter (Signed)
 Patient Ins has the response below so they are probably going to deny [facility]

## 2024-11-07 NOTE — Telephone Encounter (Signed)
 Do we need that PA? Doesn't the company offer bridge program for charles [facility]wab?

## 2024-11-07 NOTE — Telephone Encounter (Signed)
 Ins denied [employer] due to below Your plan only covers this drug when you meet one of these options: A) You have tried other drugs your plan covers (preferred drugs), and they did not work well for you, or B) Your doctor gives us  a medical reason you cannot take those other drugs. For your plan, you may need to try up to three preferred drugs. Your records must be provided and must show what your doctor tells us . We have denied your request because you do not meet any of these conditions. We reviewed the information we had. Your request has been denied. Your doctor can send us  any new or missing information for us  to review. The preferred drugs for your plan are: Ruconest and icatibant 

## 2024-11-08 NOTE — Telephone Encounter (Addendum)
 Most companies do have bridge programs but they are for limited time and still require to try for approval/denial and then they will be calling for us  to do appeal. Standard for all bridge programs. I can sign her up dont know how long they will supply

## 2024-11-14 ENCOUNTER — Telehealth: Payer: Self-pay | Admitting: *Deleted

## 2024-11-14 NOTE — Telephone Encounter (Signed)
 Called patient and advised Ins denial for [facility] and will submit form to hub for quick start program

## 2024-11-14 NOTE — Telephone Encounter (Signed)
-----   Message from Orlan Cramp, DO sent at 10/26/2024  2:45 PM EST ----- Jasmine Le, can you do PA for [facility] for acute HAE attacks? Patient is a little needlephobic. Thank you.

## 2025-04-26 ENCOUNTER — Ambulatory Visit: Admitting: Allergy
